# Patient Record
Sex: Female | Born: 1970 | Race: Black or African American | Hispanic: No | Marital: Single | Smoking: Never smoker
Health system: Southern US, Community
[De-identification: ages and names within clinical notes are randomized; demographics above are authoritative.]

## PROBLEM LIST (undated history)

## (undated) DIAGNOSIS — Z9221 Personal history of antineoplastic chemotherapy: Secondary | ICD-10-CM

## (undated) DIAGNOSIS — Z923 Personal history of irradiation: Secondary | ICD-10-CM

## (undated) DIAGNOSIS — M1990 Unspecified osteoarthritis, unspecified site: Secondary | ICD-10-CM

---

## 1997-08-20 ENCOUNTER — Ambulatory Visit (HOSPITAL_COMMUNITY): Admission: RE | Admit: 1997-08-20 | Discharge: 1997-08-20 | Payer: Self-pay | Admitting: *Deleted

## 1997-08-20 ENCOUNTER — Other Ambulatory Visit: Admission: RE | Admit: 1997-08-20 | Discharge: 1997-08-20 | Payer: Self-pay | Admitting: *Deleted

## 1997-10-16 ENCOUNTER — Ambulatory Visit (HOSPITAL_COMMUNITY): Admission: RE | Admit: 1997-10-16 | Discharge: 1997-10-16 | Payer: Self-pay | Admitting: *Deleted

## 1997-11-12 ENCOUNTER — Ambulatory Visit (HOSPITAL_COMMUNITY): Admission: RE | Admit: 1997-11-12 | Discharge: 1997-11-12 | Payer: Self-pay | Admitting: *Deleted

## 1997-11-28 ENCOUNTER — Ambulatory Visit (HOSPITAL_COMMUNITY): Admission: RE | Admit: 1997-11-28 | Discharge: 1997-11-28 | Payer: Self-pay | Admitting: *Deleted

## 1998-01-30 ENCOUNTER — Inpatient Hospital Stay (HOSPITAL_COMMUNITY): Admission: EM | Admit: 1998-01-30 | Discharge: 1998-01-31 | Payer: Self-pay | Admitting: *Deleted

## 2005-02-21 ENCOUNTER — Emergency Department (HOSPITAL_COMMUNITY): Admission: AD | Admit: 2005-02-21 | Discharge: 2005-02-21 | Payer: Self-pay | Admitting: Emergency Medicine

## 2005-07-20 ENCOUNTER — Ambulatory Visit (HOSPITAL_COMMUNITY): Admission: RE | Admit: 2005-07-20 | Discharge: 2005-07-20 | Payer: Self-pay | Admitting: Obstetrics and Gynecology

## 2005-07-20 ENCOUNTER — Encounter (INDEPENDENT_AMBULATORY_CARE_PROVIDER_SITE_OTHER): Payer: Self-pay | Admitting: Specialist

## 2010-02-04 ENCOUNTER — Emergency Department (HOSPITAL_COMMUNITY)
Admission: EM | Admit: 2010-02-04 | Discharge: 2010-02-04 | Payer: Self-pay | Source: Home / Self Care | Admitting: Emergency Medicine

## 2010-07-03 NOTE — Op Note (Signed)
NAMESAYDA, GRABLE               ACCOUNT NO.:  [id number]   MEDICAL RECORD NO.:  [id number]          PATIENT TYPE:  AMB   LOCATION:  SDC                           FACILITY:  [facility]   PHYSICIAN:  Naima A. Dillard, M.D. DATE OF BIRTH:  Sep 11, 1970   DATE OF PROCEDURE:  07/20/2005  DATE OF DISCHARGE:                                 OPERATIVE REPORT   PREOPERATIVE DIAGNOSIS:  Cervical dysplasia and menorrhagia.   POSTOPERATIVE DIAGNOSIS:  Cervical dysplasia and menorrhagia.   PROCEDURE:  Dilatation and curettage hysteroscopy, NovaSure ablation and  LEEP.   SURGEON:  Naima A. Dillard, M.D.   ASSISTANT:  None.   ANESTHESIA:  General laryngeal mask airway and local.   SPECIMENS:  ECC endometrial curettage and cervical LEEP.   ESTIMATED BLOOD LOSS:  Minimal.   COMPLICATIONS:  None.  Patient went to the recovery room in stable  condition.   PROCEDURE:  Patient was taken to the operating room, where she was given  general anesthesia, placed in a dorsal lithotomy position, prepped and  draped in a normal sterile fashion.  A colposcopy was done and noted to have  some acetyl white changes at 12 o'clock.  Next, a single-tooth tenaculum was  placed on the cervix, and the endocervical length was found to be 4 cm.  __________ was 9 cm.  The uterine cavity was at 5 cm.  The cervix was  further dilated with [facility] dilators.  The hysteroscope was placed into the  uterine cavity.  Both ostia were visualized.  All walls of the uterus were  visualized.  There were no submucosal fibroids or polyps.  At that time, the  hysteroscope was then removed.  Endometrial curettings were obtained with a  sharp curette, then endometrial curettings were obtained with a sharp  curette.  The NovaSure was then placed into the uterine cavity and seated.  On the first check, it did fail because you could hear air coming from the  cervix.  This was then corrected with Vaseline gauze.  The NovaSure did  ablate for  105 seconds with a power of 116, and the cavity was noted to be  4.2.  After the NovaSure was done, I did take another look inside the  uterine cavity, and there was noted to be no perforations or problems, and  the ablation was successful.  The deficit was 100 cc, and actually some of  that fell on the floor and under the patient's garment.  The coated speculum  was then placed in the vagina, and the metal speculum was replaced with the  coated speculum.  The cervix was painted with Lugol solution, and the LEEP  was done without difficulty.  The  cervical bed was made hemostatic with Bovie cautery and Monsel's.  The  tenaculum site also had some bleeding and made hemostatic with Bovie  cautery.  All instruments were removed from the vagina.  Sponge, lap, and  needle counts were correct x2.  The patient taken to the recovery room in  stable condition.      Naima A. Normand Sloop, M.D.  Electronically  Signed     NAD/MEDQ  D:  07/20/2005  T:  07/21/2005  Job:  [id number]

## 2013-06-21 ENCOUNTER — Emergency Department (INDEPENDENT_AMBULATORY_CARE_PROVIDER_SITE_OTHER)
Admission: EM | Admit: 2013-06-21 | Discharge: 2013-06-21 | Disposition: A | Discharge status: Home / Self Care | Payer: Self-pay | Source: Home / Self Care

## 2013-06-21 ENCOUNTER — Encounter (HOSPITAL_COMMUNITY): Payer: Self-pay | Admitting: Emergency Medicine

## 2013-06-21 DIAGNOSIS — L910 Hypertrophic scar: Secondary | ICD-10-CM

## 2013-06-21 NOTE — ED Notes (Signed)
See physician note

## 2013-06-21 NOTE — Discharge Instructions (Signed)
Thank you for coming in today. Return if the scar starts changing.

## 2013-06-21 NOTE — ED Provider Notes (Signed)
Amanda Ayers is a 43 y.o. female who presents to Urgent Care today for bump on. Patient notes a bump on her left side of her anterior neck present for over one year. It developed after a small wound. It has been present and unchanged for over one year. The patient's daughter is worried that she may have some for skin cancer. She denies any fevers chills nausea vomiting or diarrhea. The mass does not hurt.   No past medical history on file. History  Substance Use Topics  . Smoking status: Not on file  . Smokeless tobacco: Not on file  . Alcohol Use: Not on file   ROS as above Medications: No current facility-administered medications for this encounter.   No current outpatient prescriptions on file.    Exam:  BP 127/77  Pulse 72  Temp(Src) 98.1 F (36.7 C) (Oral)  Resp 16  SpO2 100% Gen: Well NAD Skin: 1 cm hypertrophic scar on the left anterior neck.   No results found for this or any previous visit (from the past 24 hour(s)). No results found.  Assessment and Plan: 43 y.o. female with masses consistent with keloid or hypertrophic scar. Patient is asymptomatic. Watchful waiting. Handout provided.  Discussed warning signs or symptoms. Please see discharge instructions. Patient expresses understanding.    Gregor Hams, MD 06/21/13 [phone]

## 2017-08-31 ENCOUNTER — Ambulatory Visit (HOSPITAL_COMMUNITY)
Admission: EM | Admit: 2017-08-31 | Discharge: 2017-08-31 | Disposition: A | Discharge status: Home / Self Care | Payer: BLUE CROSS/BLUE SHIELD | Attending: Internal Medicine | Admitting: Internal Medicine

## 2017-08-31 ENCOUNTER — Encounter (HOSPITAL_COMMUNITY): Payer: Self-pay | Admitting: Emergency Medicine

## 2017-08-31 DIAGNOSIS — R21 Rash and other nonspecific skin eruption: Secondary | ICD-10-CM

## 2017-08-31 MED ORDER — TRIAMCINOLONE ACETONIDE 0.1 % EX CREA: 1.0000 "application " | TOPICAL_CREAM | Freq: Two times a day (BID) | CUTANEOUS | 0 refills | Status: DC

## 2017-08-31 MED ORDER — DOXYCYCLINE HYCLATE 100 MG PO CAPS: 100.0000 mg | ORAL_CAPSULE | Freq: Two times a day (BID) | ORAL | 0 refills | Status: DC

## 2017-08-31 NOTE — ED Triage Notes (Signed)
Pt here for discoloration and redness after bug bite to left ankle

## 2017-08-31 NOTE — Discharge Instructions (Signed)
Begin doxycycline twice daily for the next 10 days May apply triamcinolone cream or over-the-counter hydrocortisone cream twice daily to the area with redness  May take antihistamines-Zyrtec/Claritin in the morning, Benadryl in the evening  Continue to monitor, follow-up if spreading, worsening, developing pain or drainage

## 2017-09-01 NOTE — ED Provider Notes (Signed)
[place] [facility]    CSN: [id number] Arrival date & time: 08/31/17  1724     History   Chief Complaint Chief Complaint  Patient presents with  . Rash    HPI Amanda Ayers is a 47 y.o. female no contributing past medical history presenting today for evaluation of possible bug bite/rash.  Patient states that she believes that she had a bug bite Monday evening, since she has developed discoloration and redness around this area.  She notes that it has continued to spread today.  But still localized to left lower leg.  Associated with minimal itching and pain.  States that it feels slightly sore in the area that has spread today.  She has not applied anything to this.  She is unsure if she had any bug bites, but notes that she was in the yard this weekend and frequently likes to sit outside.   HPI  History reviewed. No pertinent past medical history.  There are no active problems to display for this patient.   History reviewed. No pertinent surgical history.  OB History   None      Home Medications    Prior to Admission medications   Medication Sig Start Date End Date Taking? Authorizing Provider  doxycycline (VIBRAMYCIN) 100 MG capsule Take 1 capsule (100 mg total) by mouth 2 (two) times daily. 08/31/17   Wieters, Hallie C, PA-C  triamcinolone cream (KENALOG) 0.1 % Apply 1 application topically 2 (two) times daily. 08/31/17   Wieters, Elesa Hacker, PA-C    Family History History reviewed. No pertinent family history.  Social History Social History   Tobacco Use  . Smoking status: Not on file  Substance Use Topics  . Alcohol use: Not on file  . Drug use: Not on file     Allergies   Patient has no known allergies.   Review of Systems Review of Systems  Constitutional: Negative for fatigue and fever.  Eyes: Negative for visual disturbance.  Respiratory: Negative for shortness of breath.   Cardiovascular: Negative for chest pain.  Gastrointestinal: Negative  for abdominal pain, nausea and vomiting.  Musculoskeletal: Negative for arthralgias and joint swelling.  Skin: Positive for color change and rash. Negative for wound.  Neurological: Negative for dizziness, weakness, light-headedness and headaches.     Physical Exam Triage Vital Signs ED Triage Vitals  Enc Vitals Group     BP 08/31/17 1751 (!) 142/74     Pulse Rate 08/31/17 1751 77     Resp 08/31/17 1751 16     Temp 08/31/17 1751 97.9 F (36.6 C)     Temp Source 08/31/17 1751 Oral     SpO2 08/31/17 1751 100 %     Weight --      Height --      Head Circumference --      Peak Flow --      Pain Score 08/31/17 1824 0     Pain Loc --      Pain Edu? --      Excl. in [place]? --    No data found.  Updated Vital Signs BP (!) 142/74 (BP Location: Right Arm)   Pulse 77   Temp 97.9 F (36.6 C) (Oral)   Resp 16   SpO2 100%   Visual Acuity Right Eye Distance:   Left Eye Distance:   Bilateral Distance:    Right Eye Near:   Left Eye Near:    Bilateral Near:     Physical  Exam  Constitutional: She is oriented to person, place, and time. She appears well-developed and well-nourished.  No acute distress  HENT:  Head: Normocephalic and atraumatic.  Nose: Nose normal.  Eyes: Conjunctivae are normal.  Neck: Neck supple.  Cardiovascular: Normal rate.  Pulmonary/Chest: Effort normal. No respiratory distress.  Abdominal: She exhibits no distension.  Musculoskeletal: Normal range of motion.  Neurological: She is alert and oriented to person, place, and time.  Skin: Skin is warm and dry.  Patient with large area to anterior aspect of left lower leg with central area of slight purple discoloration, and middle of this area there is a small punctate lesion that could represent a bug bite, purple ulcer surrounded by papular erythematous slightly raised lesions.  Slightly tender to touch.  Both purple and erythematous areas non-blanchable.  Psychiatric: She has a normal mood and affect.    Nursing note and vitals reviewed.        UC Treatments / Results  Labs (all labs ordered are listed, but only abnormal results are displayed) Labs Reviewed - No data to display  EKG None  Radiology No results found.  Procedures Procedures (including critical care time)  Medications Ordered in UC Medications - No data to display  Initial Impression / Assessment and Plan / UC Course  I have reviewed the triage vital signs and the nursing notes.  Pertinent labs & imaging results that were available during my care of the patient were reviewed by me and considered in my medical decision making (see chart for details).     Lesion to lower leg did not appear clearly cellulitic or allergic type rash.  Possible tick bite.  Will treat with doxycycline as well as topical steroid cream.  Continue to monitor, follow-up if worsening or spreading.Discussed strict return precautions. Patient verbalized understanding and is agreeable with plan.  Final Clinical Impressions(s) / UC Diagnoses   Final diagnoses:  Rash and nonspecific skin eruption     Discharge Instructions     Begin doxycycline twice daily for the next 10 days May apply triamcinolone cream or over-the-counter hydrocortisone cream twice daily to the area with redness  May take antihistamines-Zyrtec/Claritin in the morning, Benadryl in the evening  Continue to monitor, follow-up if spreading, worsening, developing pain or drainage    ED Prescriptions    Medication Sig Dispense Auth. Provider   doxycycline (VIBRAMYCIN) 100 MG capsule Take 1 capsule (100 mg total) by mouth 2 (two) times daily. 20 capsule Wieters, Hallie C, PA-C   triamcinolone cream (KENALOG) 0.1 % Apply 1 application topically 2 (two) times daily. 30 g Wieters, [place] C, PA-C     Controlled Substance Prescriptions [place] Controlled Substance Registry consulted? Not Applicable   Janith Lima, [place] 09/01/17 [id number]

## 2019-06-07 ENCOUNTER — Ambulatory Visit: Payer: Self-pay

## 2019-06-07 ENCOUNTER — Encounter: Payer: Self-pay | Admitting: Podiatry

## 2019-07-09 NOTE — Progress Notes (Signed)
This encounter was created in error - please disregard.

## 2020-05-16 ENCOUNTER — Ambulatory Visit (INDEPENDENT_AMBULATORY_CARE_PROVIDER_SITE_OTHER): Payer: BC Managed Care – PPO

## 2020-05-16 ENCOUNTER — Other Ambulatory Visit: Payer: Self-pay

## 2020-05-16 ENCOUNTER — Ambulatory Visit: Payer: BC Managed Care – PPO | Admitting: Podiatry

## 2020-05-16 DIAGNOSIS — M7731 Calcaneal spur, right foot: Secondary | ICD-10-CM | POA: Diagnosis not present

## 2020-05-16 DIAGNOSIS — M79676 Pain in unspecified toe(s): Secondary | ICD-10-CM

## 2020-05-16 DIAGNOSIS — M722 Plantar fascial fibromatosis: Secondary | ICD-10-CM

## 2020-05-16 DIAGNOSIS — M7732 Calcaneal spur, left foot: Secondary | ICD-10-CM

## 2020-05-16 DIAGNOSIS — M216X9 Other acquired deformities of unspecified foot: Secondary | ICD-10-CM

## 2020-05-16 DIAGNOSIS — L600 Ingrowing nail: Secondary | ICD-10-CM

## 2020-05-16 MED ORDER — BETAMETHASONE SOD PHOS & ACET 6 (3-3) MG/ML IJ SUSP
12.0000 mg | Freq: Once | INTRAMUSCULAR | Status: AC
  Administered 2020-05-16: 12 mg

## 2020-05-16 NOTE — Progress Notes (Signed)
  Subjective:  Patient ID: Amanda Ayers, female    DOB: [birth year],  MRN: [id number]  Chief Complaint  Patient presents with  . Foot Pain    Bilateral heel pain. Left more than right. Sharp pain associated with edema.  . Toe Pain    Right great toe pain locate at medial side of toe, burning pain associated with redness, and edema.    50 y.o. female presents with the above complaint. History confirmed with patient.   Objective:  Physical Exam: warm, good capillary refill, no trophic changes or ulcerative lesions, normal DP and PT pulses and normal sensory exam. Left Foot: tenderness to palpation medial calcaneal tuber, no pain with calcaneal squeeze, decreased ankle joint ROM and +Silverskiold test  Ingrown nail to the left lateral border of the great toe Right Foot: tenderness to palpation medial calcaneal tuber, no pain with calcaneal squeeze, decreased ankle joint ROM and +Silverskiold test.  Ingrown nail to the right lateral border of the great toe  Radiographs: X-ray of both feet: no evidence of calcaneal stress fracture, plantar calcaneal spur, posterior calcaneal spur and Haglund deformity noted  Assessment:   1. Plantar fasciitis, bilateral   2. Equinus deformity of foot   3. Calcaneal spur of left foot   4. Calcaneal spur of right foot   5. Ingrown nail   6. Pain around toenail    Plan:  Patient was evaluated and treated and all questions answered.  Plantar Fasciitis -XR reviewed with patient -Educated patient on stretching and icing of the affected limb -Night splint dispensed -Plantar fascial brace dispensed -Injection delivered to the plantar fascia of both feet.  Procedure: Injection Tendon/Ligament Consent: Verbal consent obtained. Location: Bilateral plantar fascia at the glabrous junction; medial approach. Skin Prep: Alcohol. Injectate: 1 cc 0.5% marcaine plain, 1 cc betamethasone acetate-betamethasone sodium phosphate Disposition: Patient tolerated  procedure well. Injection site dressed with a band-aid.  Ingrown toenails bilateral great toes -Left great toenail was able to be debrided in slant back fashion, right was too deep to do so today.  We will have patient come back in 2 weeks for planned ingrown nail removal of right possible left toe   Return in about 18 days (around 06/03/2020).

## 2020-05-19 ENCOUNTER — Telehealth: Payer: Self-pay | Admitting: Podiatry

## 2020-05-19 DIAGNOSIS — M79676 Pain in unspecified toe(s): Secondary | ICD-10-CM

## 2020-05-19 NOTE — Telephone Encounter (Signed)
Patient requesting intermittent FMLA, please Advise?

## 2020-05-20 NOTE — Telephone Encounter (Signed)
I'm fine with that we can do that

## 2020-06-03 ENCOUNTER — Ambulatory Visit: Payer: BC Managed Care – PPO | Admitting: Podiatry

## 2020-06-06 ENCOUNTER — Ambulatory Visit: Payer: BC Managed Care – PPO | Admitting: Podiatry

## 2020-06-20 ENCOUNTER — Ambulatory Visit (INDEPENDENT_AMBULATORY_CARE_PROVIDER_SITE_OTHER): Payer: BC Managed Care – PPO | Admitting: Podiatry

## 2020-06-20 DIAGNOSIS — Z5329 Procedure and treatment not carried out because of patient's decision for other reasons: Secondary | ICD-10-CM

## 2020-06-20 NOTE — Progress Notes (Signed)
No show for appt. 

## 2021-05-25 ENCOUNTER — Ambulatory Visit: Payer: BC Managed Care – PPO | Admitting: Internal Medicine

## 2021-06-03 ENCOUNTER — Ambulatory Visit (INDEPENDENT_AMBULATORY_CARE_PROVIDER_SITE_OTHER): Payer: BC Managed Care – PPO | Admitting: Internal Medicine

## 2021-06-03 ENCOUNTER — Telehealth: Payer: Self-pay

## 2021-06-03 ENCOUNTER — Encounter: Payer: Self-pay | Admitting: Internal Medicine

## 2021-06-03 VITALS — BP 112/68 | HR 71 | Resp 18 | Ht 68.0 in | Wt 245.2 lb

## 2021-06-03 DIAGNOSIS — E669 Obesity, unspecified: Secondary | ICD-10-CM | POA: Diagnosis not present

## 2021-06-03 DIAGNOSIS — Z1211 Encounter for screening for malignant neoplasm of colon: Secondary | ICD-10-CM

## 2021-06-03 DIAGNOSIS — Z0000 Encounter for general adult medical examination without abnormal findings: Secondary | ICD-10-CM

## 2021-06-03 DIAGNOSIS — Z1231 Encounter for screening mammogram for malignant neoplasm of breast: Secondary | ICD-10-CM | POA: Diagnosis not present

## 2021-06-03 MED ORDER — SEMAGLUTIDE-WEIGHT MANAGEMENT 1 MG/0.5ML ~~LOC~~ SOAJ: 1.0000 mg | SUBCUTANEOUS | 0 refills | Status: DC

## 2021-06-03 MED ORDER — SEMAGLUTIDE-WEIGHT MANAGEMENT 2.4 MG/0.75ML ~~LOC~~ SOAJ: 2.4000 mg | SUBCUTANEOUS | 0 refills | Status: DC

## 2021-06-03 MED ORDER — SEMAGLUTIDE-WEIGHT MANAGEMENT 1.7 MG/0.75ML ~~LOC~~ SOAJ: 1.7000 mg | SUBCUTANEOUS | 0 refills | Status: DC

## 2021-06-03 MED ORDER — SEMAGLUTIDE-WEIGHT MANAGEMENT 0.25 MG/0.5ML ~~LOC~~ SOAJ: 0.2500 mg | SUBCUTANEOUS | 0 refills | Status: DC

## 2021-06-03 MED ORDER — SEMAGLUTIDE-WEIGHT MANAGEMENT 0.5 MG/0.5ML ~~LOC~~ SOAJ: 0.5000 mg | SUBCUTANEOUS | 0 refills | Status: DC

## 2021-06-03 NOTE — Telephone Encounter (Signed)
Pt is requesting a pill form medication as the injection was not covered. ? ?Please advise ?

## 2021-06-03 NOTE — Patient Instructions (Addendum)
We have sent in the wegovy shots to do.  ? ?Start with 0.25 mg weekly for the first month. ? ?Then do 0.5 mg weekly for the second month. ? ?Then do 1 mg weekly for the third month. ? ?Then do 1.7 mg weekly for the 4th month. ? ?Then do 2.4 mg weekly for the 5th month. ? ?If you have any problems or questions about this let us know. ? ?We have ordered the mammogram and will get you in for the colonoscopy. ? ? ?

## 2021-06-03 NOTE — Progress Notes (Signed)
? ?  Subjective:  ? ?Patient ID: Amanda Ayers, female    DOB: Oct 08, 1970, 51 y.o.   MRN: [id number] ? ?HPI ?The patient is a new 51 YO female coming in for ongoing care and weight. Desires physical as well.  ? ?PMH, [facility], social history reviewed and updated ? ?Review of Systems  ?Constitutional: Negative.   ?HENT: Negative.    ?Eyes: Negative.   ?Respiratory:  Negative for cough, chest tightness and shortness of breath.   ?Cardiovascular:  Negative for chest pain, palpitations and leg swelling.  ?Gastrointestinal:  Negative for abdominal distention, abdominal pain, constipation, diarrhea, nausea and vomiting.  ?Musculoskeletal: Negative.   ?Skin: Negative.   ?Neurological: Negative.   ?Psychiatric/Behavioral: Negative.    ? ?Objective:  ?Physical Exam ?Constitutional:   ?   Appearance: She is well-developed. She is obese.  ?HENT:  ?   Head: Normocephalic and atraumatic.  ?Cardiovascular:  ?   Rate and Rhythm: Normal rate and regular rhythm.  ?Pulmonary:  ?   Effort: Pulmonary effort is normal. No respiratory distress.  ?   Breath sounds: Normal breath sounds. No wheezing or rales.  ?Abdominal:  ?   General: Bowel sounds are normal. There is no distension.  ?   Palpations: Abdomen is soft.  ?   Tenderness: There is no abdominal tenderness. There is no rebound.  ?Musculoskeletal:  ?   Cervical back: Normal range of motion.  ?Skin: ?   General: Skin is warm and dry.  ?Neurological:  ?   Mental Status: She is alert and oriented to person, place, and time.  ?   Coordination: Coordination normal.  ? ? ?Vitals:  ? 06/03/21 0846  ?BP: 112/68  ?Pulse: 71  ?Resp: 18  ?SpO2: 98%  ?Weight: 245 lb 3.2 oz (111.2 kg)  ?Height: '5\' 8"'$  (1.727 m)  ? ? ?This visit occurred during the SARS-CoV-2 public health emergency.  Safety protocols were in place, including screening questions prior to the visit, additional usage of staff PPE, and extensive cleaning of exam room while observing appropriate contact time as indicated for disinfecting  solutions.  ? ?Assessment & Plan:  ?Shingrix IM given at visit ?

## 2021-06-04 NOTE — Telephone Encounter (Signed)
Pt states that insurance will not cover weightloss meds but she would like try phentermine and she said she can afford this out of pocket. ? ?Please advise ?

## 2021-06-04 NOTE — Telephone Encounter (Signed)
Noted  

## 2021-06-04 NOTE — Telephone Encounter (Signed)
Pt called I advised the pt of Dr. Sharlet Salina recommendation to try try qsymia or contrave she should check with insurance to see which is covered.  ? ?Pt was advised to ask what else is available if they don't cover those. Pt was asked to call us back with that information or Mychart the information. ? ?FYI ?

## 2021-06-04 NOTE — Telephone Encounter (Signed)
We can try qsymia or contrave she should check with insurance to see which is covered ?

## 2021-06-05 DIAGNOSIS — Z0000 Encounter for general adult medical examination without abnormal findings: Secondary | ICD-10-CM | POA: Insufficient documentation

## 2021-06-05 DIAGNOSIS — E669 Obesity, unspecified: Secondary | ICD-10-CM | POA: Insufficient documentation

## 2021-06-05 HISTORY — DX: Encounter for general adult medical examination without abnormal findings: Z00.00

## 2021-06-05 NOTE — Assessment & Plan Note (Signed)
She has tried phentermine multiple months in the past. We did counsel that this is not safe for long term usage. She would like to try wegovy so this is prescribed with typical 4 month titration. If not covered we have other options we can try.  ?

## 2021-06-05 NOTE — Telephone Encounter (Signed)
Phentermine is not safe and I do not prescribe this. We can try wellbutrin and/or naltrexone separate (they are in combination in one of the weight loss pills but separately are both generic). Does she want to try this? ?

## 2021-06-05 NOTE — Assessment & Plan Note (Signed)
Flu shot yearly. Covid-19 counseled. Shingrix given 1st today. Tetanus declines today. Colonoscopy referral to GI done. Mammogram ordered, pap smear informed due. Counseled about sun safety and mole surveillance. Counseled about the dangers of distracted driving. Given 10 year screening recommendations.  ? ?

## 2021-06-06 ENCOUNTER — Encounter: Payer: Self-pay | Admitting: Internal Medicine

## 2021-06-10 ENCOUNTER — Encounter: Payer: Self-pay | Admitting: Internal Medicine

## 2021-06-10 MED ORDER — BUPROPION HCL ER (XL) 150 MG PO TB24: 150.0000 mg | ORAL_TABLET | Freq: Every day | ORAL | 0 refills | Status: DC

## 2021-06-10 MED ORDER — NALTREXONE HCL 50 MG PO TABS: 25.0000 mg | ORAL_TABLET | Freq: Every day | ORAL | 0 refills | Status: DC

## 2021-06-10 NOTE — Telephone Encounter (Signed)
Spoke with the pt and she is willing to try this option instead of phentermine.  ?

## 2021-06-10 NOTE — Addendum Note (Signed)
Addended by: Pricilla Holm A on: 06/10/2021 11:28 AM ? ? Modules accepted: Orders ? ?

## 2021-06-11 ENCOUNTER — Encounter: Payer: Self-pay | Admitting: Internal Medicine

## 2021-06-19 ENCOUNTER — Ambulatory Visit
Admission: RE | Admit: 2021-06-19 | Discharge: 2021-06-19 | Disposition: A | Discharge status: Home / Self Care | Payer: BC Managed Care – PPO | Source: Ambulatory Visit | Attending: Internal Medicine | Admitting: Internal Medicine

## 2021-06-19 DIAGNOSIS — Z1231 Encounter for screening mammogram for malignant neoplasm of breast: Secondary | ICD-10-CM

## 2021-06-22 ENCOUNTER — Other Ambulatory Visit: Payer: Self-pay | Admitting: Internal Medicine

## 2021-06-22 DIAGNOSIS — R928 Other abnormal and inconclusive findings on diagnostic imaging of breast: Secondary | ICD-10-CM

## 2021-07-02 ENCOUNTER — Ambulatory Visit
Admission: RE | Admit: 2021-07-02 | Discharge: 2021-07-02 | Disposition: A | Discharge status: Home / Self Care | Payer: BC Managed Care – PPO | Source: Ambulatory Visit | Attending: Internal Medicine | Admitting: Internal Medicine

## 2021-07-02 ENCOUNTER — Other Ambulatory Visit: Payer: Self-pay | Admitting: Internal Medicine

## 2021-07-02 DIAGNOSIS — N6320 Unspecified lump in the left breast, unspecified quadrant: Secondary | ICD-10-CM

## 2021-07-02 DIAGNOSIS — R928 Other abnormal and inconclusive findings on diagnostic imaging of breast: Secondary | ICD-10-CM

## 2021-07-06 ENCOUNTER — Ambulatory Visit
Admission: RE | Admit: 2021-07-06 | Discharge: 2021-07-06 | Disposition: A | Discharge status: Home / Self Care | Payer: BC Managed Care – PPO | Source: Ambulatory Visit | Attending: Internal Medicine | Admitting: Internal Medicine

## 2021-07-06 DIAGNOSIS — N6320 Unspecified lump in the left breast, unspecified quadrant: Secondary | ICD-10-CM

## 2021-07-07 ENCOUNTER — Telehealth: Payer: Self-pay | Admitting: Hematology and Oncology

## 2021-07-07 LAB — SURGICAL PATHOLOGY

## 2021-07-07 NOTE — Telephone Encounter (Signed)
Left message for patient to return call in reference to clinic appointment for 5/31

## 2021-07-07 NOTE — Telephone Encounter (Signed)
Spoke to patient to confirm morning clinic appointment for 5/31, paperwork will be mailed

## 2021-07-14 ENCOUNTER — Encounter: Payer: Self-pay | Admitting: *Deleted

## 2021-07-14 DIAGNOSIS — Z170 Estrogen receptor positive status [ER+]: Secondary | ICD-10-CM

## 2021-07-14 DIAGNOSIS — C50212 Malignant neoplasm of upper-inner quadrant of left female breast: Secondary | ICD-10-CM | POA: Insufficient documentation

## 2021-07-14 HISTORY — DX: Malignant neoplasm of upper-inner quadrant of left female breast: C50.212

## 2021-07-14 HISTORY — DX: Malignant neoplasm of upper-inner quadrant of left female breast: Z17.0

## 2021-07-15 ENCOUNTER — Inpatient Hospital Stay: Payer: BC Managed Care – PPO | Attending: Hematology and Oncology | Admitting: Hematology and Oncology

## 2021-07-15 ENCOUNTER — Encounter: Payer: Self-pay | Admitting: *Deleted

## 2021-07-15 ENCOUNTER — Inpatient Hospital Stay: Payer: BC Managed Care – PPO

## 2021-07-15 ENCOUNTER — Ambulatory Visit
Admission: RE | Admit: 2021-07-15 | Discharge: 2021-07-15 | Disposition: A | Discharge status: Home / Self Care | Payer: BC Managed Care – PPO | Source: Ambulatory Visit | Attending: Radiation Oncology | Admitting: Radiation Oncology

## 2021-07-15 ENCOUNTER — Encounter: Payer: Self-pay | Admitting: Physical Therapy

## 2021-07-15 ENCOUNTER — Other Ambulatory Visit: Payer: Self-pay | Admitting: General Surgery

## 2021-07-15 ENCOUNTER — Other Ambulatory Visit: Payer: Self-pay

## 2021-07-15 ENCOUNTER — Ambulatory Visit: Payer: BC Managed Care – PPO | Attending: General Surgery | Admitting: Physical Therapy

## 2021-07-15 ENCOUNTER — Inpatient Hospital Stay: Payer: BC Managed Care – PPO | Admitting: Licensed Clinical Social Worker

## 2021-07-15 ENCOUNTER — Encounter: Payer: Self-pay | Admitting: General Practice

## 2021-07-15 ENCOUNTER — Encounter: Payer: Self-pay | Admitting: Hematology and Oncology

## 2021-07-15 ENCOUNTER — Inpatient Hospital Stay (HOSPITAL_BASED_OUTPATIENT_CLINIC_OR_DEPARTMENT_OTHER): Payer: BC Managed Care – PPO | Admitting: Genetic Counselor

## 2021-07-15 DIAGNOSIS — C50212 Malignant neoplasm of upper-inner quadrant of left female breast: Secondary | ICD-10-CM | POA: Insufficient documentation

## 2021-07-15 DIAGNOSIS — R293 Abnormal posture: Secondary | ICD-10-CM | POA: Diagnosis present

## 2021-07-15 DIAGNOSIS — Z170 Estrogen receptor positive status [ER+]: Secondary | ICD-10-CM

## 2021-07-15 LAB — CMP (CANCER CENTER ONLY)
ALT: 11 U/L (ref 0–44)
AST: 13 U/L — ABNORMAL LOW (ref 15–41)
Albumin: 4.3 g/dL (ref 3.5–5.0)
Alkaline Phosphatase: 31 U/L — ABNORMAL LOW (ref 38–126)
Anion gap: 4 — ABNORMAL LOW (ref 5–15)
BUN: 17 mg/dL (ref 6–20)
CO2: 28 mmol/L (ref 22–32)
Calcium: 9.5 mg/dL (ref 8.9–10.3)
Chloride: 105 mmol/L (ref 98–111)
Creatinine: 0.99 mg/dL (ref 0.44–1.00)
GFR, Estimated: >60 mL/min (ref >60)
Glucose, Bld: 86 mg/dL (ref 70–99)
Potassium: 4 mmol/L (ref 3.5–5.1)
Sodium: 137 mmol/L (ref 135–145)
Total Bilirubin: 0.4 mg/dL (ref 0.3–1.2)
Total Protein: 7.2 g/dL (ref 6.5–8.1)

## 2021-07-15 LAB — CBC WITH DIFFERENTIAL (CANCER CENTER ONLY)
Abs Immature Granulocytes: 0.02 10*3/uL (ref 0.00–0.07)
Basophils Absolute: 0.1 10*3/uL (ref 0.0–0.1)
Basophils Relative: 1 %
Eosinophils Absolute: 0.2 10*3/uL (ref 0.0–0.5)
Eosinophils Relative: 3 %
HCT: 36.6 % (ref 36.0–46.0)
Hemoglobin: 12 g/dL (ref 12.0–15.0)
Immature Granulocytes: 0 %
Lymphocytes Relative: 22 %
Lymphs Abs: 1.3 10*3/uL (ref 0.7–4.0)
MCH: 31.9 pg (ref 26.0–34.0)
MCHC: 32.8 g/dL (ref 30.0–36.0)
MCV: 97.3 fL (ref 80.0–100.0)
Monocytes Absolute: 0.6 10*3/uL (ref 0.1–1.0)
Monocytes Relative: 11 %
Neutro Abs: 3.7 10*3/uL (ref 1.7–7.7)
Neutrophils Relative %: 63 %
Platelet Count: 279 10*3/uL (ref 150–400)
RBC: 3.76 MIL/uL — ABNORMAL LOW (ref 3.87–5.11)
RDW: 11.9 % (ref 11.5–15.5)
WBC Count: 5.8 10*3/uL (ref 4.0–10.5)
nRBC: 0 % (ref 0.0–0.2)

## 2021-07-15 LAB — GENETIC SCREENING ORDER

## 2021-07-15 LAB — RESEARCH LABS

## 2021-07-15 NOTE — Progress Notes (Signed)
Radiation Oncology         [phone] ________________________________  Name: Amanda Ayers        MRN: [id number]  Date of Service: 07/15/2021 DOB: October 08, 1970  HY:[id number], Real Cons, MD  Stark Klein, MD     REFERRING PHYSICIAN: Stark Klein, MD   DIAGNOSIS: The encounter diagnosis was Malignant neoplasm of upper-inner quadrant of left breast in female, estrogen receptor positive ([place]).   HISTORY OF PRESENT ILLNESS: Amanda Ayers is a 51 y.o. female seen in the multidisciplinary breast clinic for a new diagnosis of left breast cancer. The patient was noted to have screening assymmetry in the left breast in the medial central location. By diagnostic imaging there was a mass in the 10:00 position measuring 9 mm. Her left axilla was negative for adenopathy. She underwent biopsy on 07/06/21 that showed a grade 2 invasive ductal carcinoma that was ER/PR positive, HER2 negative with a Ki 67 of 60%. She's seen to discuss treatment recommendations of her cancer.     PREVIOUS RADIATION THERAPY: No   PAST MEDICAL HISTORY:  Past Medical History:  Diagnosis Date   Malignant neoplasm of upper-inner quadrant of left breast in female, estrogen receptor positive ([place]) 07/14/2021       PAST SURGICAL HISTORY:No past surgical history on file.   FAMILY HISTORY: No family history on file.   SOCIAL HISTORY:  reports that she has never smoked. She has never been exposed to tobacco smoke. She has never used smokeless tobacco. She reports current alcohol use of about 1.0 standard drink per week. She reports that she does not use drugs. The patient is single and lives in [place]. She works at [employer]: Prednisone   MEDICATIONS:  Current Outpatient Medications  Medication Sig Dispense Refill   buPROPion (WELLBUTRIN XL) 150 MG 24 hr tablet Take 1 tablet (150 mg total) by mouth daily. 90 tablet 0   naltrexone (DEPADE) 50 MG tablet Take 0.5 tablets (25 mg total) by mouth  daily. 45 tablet 0   No current facility-administered medications for this encounter.     REVIEW OF SYSTEMS: On review of systems, the patient reports that she is doing well. No breast specific complaints are noted.      PHYSICAL EXAM:  Wt Readings from Last 3 Encounters:  07/15/21 244 lb 12.8 oz (111 kg)  06/03/21 245 lb 3.2 oz (111.2 kg)   Temp Readings from Last 3 Encounters:  07/15/21 97.8 F (36.6 C) (Temporal)  08/31/17 97.9 F (36.6 C) (Oral)  06/21/13 98.1 F (36.7 C) (Oral)   BP Readings from Last 3 Encounters:  07/15/21 123/87  06/03/21 112/68  08/31/17 (!) 142/74   Pulse Readings from Last 3 Encounters:  07/15/21 68  06/03/21 71  08/31/17 77    In general this is a well appearing African American female in no acute distress. She's alert and oriented x4 and appropriate throughout the examination. Cardiopulmonary assessment is negative for acute distress and she exhibits normal effort. Bilateral breast exam is deferred.    ECOG = 0  0 - Asymptomatic (Fully active, able to carry on all predisease activities without restriction)  1 - Symptomatic but completely ambulatory (Restricted in physically strenuous activity but ambulatory and able to carry out work of a light or sedentary nature. For example, light housework, office work)  2 - Symptomatic, <50% in bed during the day (Ambulatory and capable of all self care but unable to carry out any work activities. Up  and about more than 50% of waking hours)  3 - Symptomatic, >50% in bed, but not bedbound (Capable of only limited self-care, confined to bed or chair 50% or more of waking hours)  4 - Bedbound (Completely disabled. Cannot carry on any self-care. Totally confined to bed or chair)  5 - Death   Eustace Pen MM, Creech RH, Tormey DC, et al. [phone]). "Toxicity and response criteria of the [facility]". [place] Oncol. 5 (6): 649-55    LABORATORY DATA:  Lab Results  Component Value  Date   WBC 5.8 07/15/2021   HGB 12.0 07/15/2021   HCT 36.6 07/15/2021   MCV 97.3 07/15/2021   PLT 279 07/15/2021   Lab Results  Component Value Date   NA 137 07/15/2021   K 4.0 07/15/2021   CL 105 07/15/2021   CO2 28 07/15/2021   Lab Results  Component Value Date   ALT 11 07/15/2021   AST 13 (L) 07/15/2021   ALKPHOS 31 (L) 07/15/2021   BILITOT 0.4 07/15/2021      RADIOGRAPHY: US BREAST LTD UNI LEFT INC AXILLA  Result Date: 07/02/2021 CLINICAL DATA:  Patient returns after baseline screening study for evaluation possible LEFT breast asymmetry. EXAM: DIGITAL DIAGNOSTIC UNILATERAL LEFT MAMMOGRAM WITH TOMOSYNTHESIS AND CAD; ULTRASOUND LEFT BREAST LIMITED TECHNIQUE: Left digital diagnostic mammography and breast tomosynthesis was performed. The images were evaluated with computer-aided detection.; Targeted ultrasound examination of the left breast was performed. COMPARISON:  06/19/2021 ACR Breast Density Category b: There are scattered areas of fibroglandular density. FINDINGS: Additional 2-D and 3-D images are performed. These views confirm presence of a spiculated mass in the MEDIAL aspect of the LEFT breast. On physical exam, I palpate no mass in the MEDIAL aspect of the LEFT breast. Targeted ultrasound is performed, showing a spiculated irregular mass with indistinct and spiculated margins in the 10 o'clock location of the LEFT breast 20 centimeters from the nipple. No internal blood flow identified with Doppler evaluation. Mass measures 0.9 x 0.5 x 0.6 centimeters and is taller than wide. Evaluation of the LEFT axilla is negative for adenopathy. IMPRESSION: Suspicious mass in the 10 o'clock location of the LEFT breast. RECOMMENDATION: Ultrasound-guided core biopsy of LEFT breast mass. I have discussed the findings and recommendations with the patient. If applicable, a reminder letter will be sent to the patient regarding the next appointment. BI-RADS CATEGORY  4: Suspicious. Electronically  Signed   By: Nolon Nations M.D.   On: 07/02/2021 10:55  MM DIAG BREAST TOMO UNI LEFT  Result Date: 07/02/2021 CLINICAL DATA:  Patient returns after baseline screening study for evaluation possible LEFT breast asymmetry. EXAM: DIGITAL DIAGNOSTIC UNILATERAL LEFT MAMMOGRAM WITH TOMOSYNTHESIS AND CAD; ULTRASOUND LEFT BREAST LIMITED TECHNIQUE: Left digital diagnostic mammography and breast tomosynthesis was performed. The images were evaluated with computer-aided detection.; Targeted ultrasound examination of the left breast was performed. COMPARISON:  06/19/2021 ACR Breast Density Category b: There are scattered areas of fibroglandular density. FINDINGS: Additional 2-D and 3-D images are performed. These views confirm presence of a spiculated mass in the MEDIAL aspect of the LEFT breast. On physical exam, I palpate no mass in the MEDIAL aspect of the LEFT breast. Targeted ultrasound is performed, showing a spiculated irregular mass with indistinct and spiculated margins in the 10 o'clock location of the LEFT breast 20 centimeters from the nipple. No internal blood flow identified with Doppler evaluation. Mass measures 0.9 x 0.5 x 0.6 centimeters and is taller than wide. Evaluation of the  LEFT axilla is negative for adenopathy. IMPRESSION: Suspicious mass in the 10 o'clock location of the LEFT breast. RECOMMENDATION: Ultrasound-guided core biopsy of LEFT breast mass. I have discussed the findings and recommendations with the patient. If applicable, a reminder letter will be sent to the patient regarding the next appointment. BI-RADS CATEGORY  4: Suspicious. Electronically Signed   By: Nolon Nations M.D.   On: 07/02/2021 10:55  MM 3D SCREEN BREAST BILATERAL  Result Date: 06/19/2021 CLINICAL DATA:  Screening. This is the patient's initial baseline mammogram. EXAM: DIGITAL SCREENING BILATERAL MAMMOGRAM WITH TOMOSYNTHESIS AND CAD TECHNIQUE: Bilateral screening digital craniocaudal and mediolateral oblique  mammograms were obtained. Bilateral screening digital breast tomosynthesis was performed. The images were evaluated with computer-aided detection. COMPARISON:  None. ACR Breast Density Category b: There are scattered areas of fibroglandular density. FINDINGS: In the left breast, a possible asymmetry warrants further evaluation. In the right breast, no findings suspicious for malignancy. IMPRESSION: Further evaluation is suggested for possible asymmetry in the left breast. RECOMMENDATION: Diagnostic mammogram and possibly ultrasound of the left breast. (Code:[id number]) The patient will be contacted regarding the findings, and additional imaging will be scheduled. BI-RADS CATEGORY  0: Incomplete. Need additional imaging evaluation and/or prior mammograms for comparison. Electronically Signed   By: Evangeline Dakin M.D.   On: 06/19/2021 17:00   MM CLIP PLACEMENT LEFT  Result Date: 07/06/2021 CLINICAL DATA:  Status post ultrasound-guided biopsy of a LEFT breast mass at the 10 o'clock axis. EXAM: 3D DIAGNOSTIC LEFT MAMMOGRAM POST ULTRASOUND BIOPSY COMPARISON:  Previous exam(s). FINDINGS: 3D Mammographic images were obtained following ultrasound guided biopsy of the LEFT breast mass at the 10 o'clock axis. The biopsy marking clip is in expected position at the site of biopsy. IMPRESSION: Appropriate positioning of the ribbon shaped biopsy marking clip at the site of biopsy in the inner LEFT breast corresponding to the targeted mass at the 10 o'clock axis. Final Assessment: Post Procedure Mammograms for Marker Placement Electronically Signed   By: Franki Cabot M.D.   On: 07/06/2021 12:31  [place] LT BREAST BX W LOC DEV 1ST LESION IMG BX SPEC US GUIDE  Addendum Date: 07/08/2021   ADDENDUM REPORT: 07/08/2021 07:55 ADDENDUM: Pathology revealed GRADE II INVASIVE DUCTAL CARCINOMA of the LEFT breast, 10:00 o'clock, 20 cmfn, (ribbon clip). This was found to be concordant by Dr. Franki Cabot. Pathology results were discussed  with the patient by telephone. The patient reported doing well after the biopsy with tenderness at the site. Post biopsy instructions and care were reviewed and questions were answered. The patient was encouraged to call [facility] for any additional concerns. My direct phone number was provided. The patient was referred to [facility] on Jul 15, 2021. Pathology results reported by Terie Purser, RN on 07/08/2021. Electronically Signed   By: Franki Cabot M.D.   On: 07/08/2021 07:55   Result Date: 07/08/2021 CLINICAL DATA:  Patient with a LEFT breast mass at the 10 o'clock axis presents today for ultrasound-guided core biopsy. EXAM: ULTRASOUND GUIDED LEFT BREAST CORE NEEDLE BIOPSY COMPARISON:  None Available. PROCEDURE: I met with the patient and we discussed the procedure of ultrasound-guided biopsy, including benefits and alternatives. We discussed the high likelihood of a successful procedure. We discussed the risks of the procedure, including infection, bleeding, tissue injury, clip migration, and inadequate sampling. Informed written consent was given. The usual time-out protocol was performed immediately prior to the procedure.  Lesion quadrant: Upper inner quadrant Using sterile technique and 1% Lidocaine as local anesthetic, under direct ultrasound visualization, a 12 gauge spring-loaded device was used to perform biopsy of the LEFT breast mass at the 10 o'clock axis using an inferior approach. At the conclusion of the procedure , a ribbon shaped tissue marker clip was deployed into the biopsy cavity. Follow up 2 view mammogram was performed and dictated separately. IMPRESSION: Ultrasound guided biopsy of the LEFT breast mass at the 10 o'clock axis. No apparent complications. Electronically Signed: By: Franki Cabot M.D. On: 07/06/2021 12:07      IMPRESSION/PLAN: 1. Stage IA, [id number], grade 2, ER/PR  positive invasive ductal carcinoma of the left breast. Dr. Lisbeth Renshaw discusses the pathology findings and reviews the nature of left breast disease. The consensus from the breast conference includes breast conservation with lumpectomy with sentinel node biopsy. Dr. Chryl Heck plans to order Oncotype Dx score to determine a role for systemic therapy. Provided that chemotherapy is not indicated, the patient's course would then be followed by external radiotherapy to the breast  to reduce risks of local recurrence followed by antiestrogen therapy. We discussed the risks, benefits, short, and long term effects of radiotherapy, as well as the curative intent, and the patient is interested in proceeding. Dr. Lisbeth Renshaw discusses the delivery and logistics of radiotherapy and anticipates a course of 4 or 6 1/2 weeks of radiotherapy. We will see her back a few weeks after surgery to discuss the simulation process and anticipate we starting radiotherapy about 4-6 weeks after surgery.  2. Possible genetic predisposition to malignancy. The patient is a candidate for genetic testing given her personal history. She was offered referral and will see genetics today.   In a visit lasting 60 minutes, greater than 50% of the time was spent face to face reviewing her case, as well as in preparation of, discussing, and coordinating the patient's care.  The above documentation reflects my direct findings during this shared patient visit. Please see the separate note by Dr. Lisbeth Renshaw on this date for the remainder of the patient's plan of care.    Carola Rhine, [facility]    **Disclaimer: This note was dictated with voice recognition software. Similar sounding words can inadvertently be transcribed and this note may contain transcription errors which may not have been corrected upon publication of note.**

## 2021-07-15 NOTE — Progress Notes (Signed)
[facility] NOTE  Patient Care Team: Hoyt Koch, MD as PCP - General (Internal Medicine) Stark Klein, MD as Consulting Physician (General Surgery) Benay Pike, MD as Consulting Physician (Hematology and Oncology) Kyung Rudd, MD as Consulting Physician (Radiation Oncology) Mauro Kaufmann, RN as Oncology Nurse Navigator Rockwell Germany, RN as Oncology Nurse Navigator  CHIEF COMPLAINTS/PURPOSE OF CONSULTATION:  Newly diagnosed breast cancer  HISTORY OF PRESENTING ILLNESS:  Amanda Ayers 51 y.o. female is here because of recent diagnosis of left breast cancer  I reviewed her records extensively and collaborated the history with the patient.  SUMMARY OF ONCOLOGIC HISTORY: Oncology History  Malignant neoplasm of upper-inner quadrant of left breast in female, estrogen receptor positive ([place])  06/19/2021 Mammogram   Mammogram from Jun 19, 2021 showed possible asymmetry in the left breast.  Diagnostic mammogram showed suspicious mass in the 10:00 location of the left breast.  Ultrasound of the left axilla negative for lymphadenopathy   07/14/2021 Initial Diagnosis   Malignant neoplasm of upper-inner quadrant of left breast in female, estrogen receptor positive [facility])    Pathology Results   Pathology from the left breast showed invasive ductal carcinoma grade 2 out of 3.  Prognostic showed ER 90% positive strong staining, PR 95% strong staining, Ki-67 of 60% and HER2 equivocal by IHC and FISH is pending    Interval History  Amanda Ayers is here for an initial visit with her 2 daughters.  She is a very healthy 72 year old with no past medical history, does not take any medication except for some over-the-counter medication such as Advil PM for sleep.  She has never had any abnormal mammograms.  She did not feel any masses in her breast.  She feels very well overall.  Rest of the pertinent 10 point ROS reviewed and negative.  MEDICAL HISTORY:  Past  Medical History:  Diagnosis Date   Malignant neoplasm of upper-inner quadrant of left breast in female, estrogen receptor positive ([place]) 07/14/2021    SURGICAL HISTORY: No past surgical history on file.  SOCIAL HISTORY: Social History   Socioeconomic History   Marital status: Single    Spouse name: Not on file   Number of children: Not on file   Years of education: Not on file   Highest education level: Not on file  Occupational History   Not on file  Tobacco Use   Smoking status: Never    Passive exposure: Never   Smokeless tobacco: Never  Substance and Sexual Activity   Alcohol use: Yes    Alcohol/week: 1.0 standard drink    Types: 1 Cans of beer per week   Drug use: Never   Sexual activity: Yes    Birth control/protection: None  Other Topics Concern   Not on file  Social History Narrative   Not on file   Social Determinants of Health   Financial Resource Strain: Not on file  Food Insecurity: Not on file  Transportation Needs: Not on file  Physical Activity: Not on file  Stress: Not on file  Social Connections: Not on file  Intimate Partner Violence: Not on file    FAMILY HISTORY: No family history on file.  ALLERGIES:  is allergic to prednisone.  MEDICATIONS:  No current outpatient medications on file.   No current facility-administered medications for this visit.    REVIEW OF SYSTEMS:   Constitutional: Denies fevers, chills or abnormal night sweats Eyes: Denies blurriness of vision, double vision or watery  eyes Ears, nose, mouth, throat, and face: Denies mucositis or sore throat Respiratory: Denies cough, dyspnea or wheezes Cardiovascular: Denies palpitation, chest discomfort or lower extremity swelling Gastrointestinal:  Denies nausea, heartburn or change in bowel habits Skin: Denies abnormal skin rashes Lymphatics: Denies new lymphadenopathy or easy bruising Neurological:Denies numbness, tingling or new weaknesses Behavioral/Psych: Mood is  stable, no new changes  Breast: Denies any palpable lumps or discharge All other systems were reviewed with the patient and are negative.  PHYSICAL EXAMINATION: ECOG PERFORMANCE STATUS: 0 - Asymptomatic  Vitals:   07/15/21 0856  BP: 123/87  Pulse: 68  Resp: 18  Temp: 97.8 F (36.6 C)  SpO2: 100%   Filed Weights   07/15/21 0856  Weight: 244 lb 12.8 oz (111 kg)    GENERAL:alert, no distress and comfortable SKIN: skin color, texture, turgor are normal, no rashes or significant lesions EYES: normal, conjunctiva are pink and non-injected, sclera clear OROPHARYNX:no exudate, no erythema and lips, buccal mucosa, and tongue normal  NECK: supple, thyroid normal size, non-tender, without nodularity LYMPH:  no palpable lymphadenopathy in the cervical, axillary or inguinal LUNGS: clear to auscultation and percussion with normal breathing effort HEART: regular rate & rhythm and no murmurs and no lower extremity edema ABDOMEN:abdomen soft, non-tender and normal bowel sounds Musculoskeletal:no cyanosis of digits and no clubbing  PSYCH: alert & oriented x 3 with fluent speech NEURO: no focal motor/sensory deficits BREAST: Small palpable mass at the area of biopsy which could be a postbiopsy hematoma.  No palpable regional adenopathy of the left breast  LABORATORY DATA:  I have reviewed the data as listed Lab Results  Component Value Date   WBC 5.8 07/15/2021   HGB 12.0 07/15/2021   HCT 36.6 07/15/2021   MCV 97.3 07/15/2021   PLT 279 07/15/2021   Lab Results  Component Value Date   NA 137 07/15/2021   K 4.0 07/15/2021   CL 105 07/15/2021   CO2 28 07/15/2021    RADIOGRAPHIC STUDIES: I have personally reviewed the radiological reports and agreed with the findings in the report.  ASSESSMENT AND PLAN:  Malignant neoplasm of upper-inner quadrant of left breast in female, estrogen receptor positive ([place]) This is a very pleasant 51 year old postmenopausal female patient with no  significant past medical history with newly diagnosed left breast invasive ductal carcinoma, grade 2 out of 3, ER +90% strong staining PR 95% strong staining, Ki-67 of 60% and HER2 equivocal by IHC and FISH is negative according to verbal report.  Since this is HER2 negative, we have discussed about Oncotype testing today and the following details. We have discussed about Oncotype Dx score which is a well validated prognostic scoring system which can predict outcome with endocrine therapy alone and whether chemotherapy reduces recurrence.  Typically in patients with ER positive cancers that are node negative if the RS score is high typically greater than or equal to 26, chemotherapy is recommended.  In women with intermediate recurrence score younger than 70, there can still be some role for chemotherapy in addition to endocrine therapy especially if the recurrence score is between 21-25. If chemotherapy is needed, this will precede radiation and then after radiation she will continue on antiestrogen therapy.  We have also discussed the following details about antiestrogen therapy. With regards to Tamoxifen, we discussed that this is a SERM, selective estrogen receptor modulator. We discussed mechanism of action of Tamoxifen, adverse effects on Tamoxifen including but not limited to post menopausal symptoms, increased risk of DVT/PE,  increased risk of endometrial cancer, questionable cataracts with long term use and increased risk of cardiovascular events in the study which was not statistically significant. A benefit from Tamoxifen would be improvement in bone density. With regards to aromatase inhibitors, we discussed mechanism of action, adverse effects including but not limited to post menopausal symptoms, arthralgias, myalgias, increased risk of cardiovascular events and bone loss.   She has not had any postmenopausal symptoms.  She does not have any preference for antiestrogen therapy but we will talk  to her about it again after surgery and review final recommendations.\  y   Total time spent: 60 minutes including history, review of records, counseling and coordination of care  All questions were answered. The patient knows to call the clinic with any problems, questions or concerns.    Benay Pike, MD 07/15/21

## 2021-07-15 NOTE — Assessment & Plan Note (Addendum)
This is a very pleasant 50 year old postmenopausal female patient with no significant past medical history with newly diagnosed left breast invasive ductal carcinoma, grade 2 out of 3, ER +90% strong staining PR 95% strong staining, Ki-67 of 60% and HER2 equivocal by IHC and FISH is negative according to verbal report.  Since this is HER2 negative, we have discussed about Oncotype testing today and the following details. We have discussed about Oncotype Dx score which is a well validated prognostic scoring system which can predict outcome with endocrine therapy alone and whether chemotherapy reduces recurrence.  Typically in patients with ER positive cancers that are node negative if the RS score is high typically greater than or equal to 26, chemotherapy is recommended.  In women with intermediate recurrence score younger than 66, there can still be some role for chemotherapy in addition to endocrine therapy especially if the recurrence score is between 21-25. If chemotherapy is needed, this will precede radiation and then after radiation she will continue on antiestrogen therapy.  We have also discussed the following details about antiestrogen therapy. With regards to Tamoxifen, we discussed that this is a SERM, selective estrogen receptor modulator. We discussed mechanism of action of Tamoxifen, adverse effects on Tamoxifen including but not limited to post menopausal symptoms, increased risk of DVT/PE, increased risk of endometrial cancer, questionable cataracts with long term use and increased risk of cardiovascular events in the study which was not statistically significant. A benefit from Tamoxifen would be improvement in bone density. With regards to aromatase inhibitors, we discussed mechanism of action, adverse effects including but not limited to post menopausal symptoms, arthralgias, myalgias, increased risk of cardiovascular events and bone loss.   She has not had any postmenopausal symptoms.   She does not have any preference for antiestrogen therapy but we will talk to her about it again after surgery and review final recommendations.\  y

## 2021-07-15 NOTE — Progress Notes (Signed)
[facility] Psychosocial Distress Screening Spiritual Care  Met with Amanda Ayers and her eldest and youngest daughters (middle daughter had to work) in [facility] to introduce Support Center team/resources, reviewing distress screen per protocol.  The patient scored a  0  on the Psychosocial Distress Thermometer which indicates  minimal  distress. Also assessed for distress and other psychosocial needs.    07/15/2021  ONCBCN DISTRESS SCREENING   Screening Type Initial Screening   Distress experienced in past week (1-10) 0   Referral to support programs Yes    Amanda Ayers preferred to meet with chaplain as representative of [facility] this morning. She was in good spirits, noting that meeting team and learning details about diagnosis and treatment helped resolve the unknown and reduce distress.   Provided pastoral presence, empathic listening, affirmation of strengths, and introduction to [facility] support programming. Also provided prayer per request.  Follow up needed: Yes.  [name] requests a follow-up call in ca two weeks.   Chaplain Lisa Lundeen, MDiv, [facility] Pager [phone] Voicemail [phone]       

## 2021-07-15 NOTE — Research (Signed)
[employer] 2021-05 - Specimen Collection Study to Evaluate Biomarkers in Subjects with Cancer    This Nurse has reviewed this patient's inclusion and exclusion criteria as a second review and confirms Amanda Ayers is eligible for study participation.  Patient may continue with enrollment.   [name] Deerica Waszak, RN, BSN, [facility]  Her  [facility] Clinical Research Nurse [place] [phone]  Pager [phone] 07/15/2021 12:05 PM

## 2021-07-15 NOTE — Therapy (Signed)
OUTPATIENT PHYSICAL THERAPY BREAST CANCER BASELINE EVALUATION   Patient Name: Amanda Ayers MRN: [id number] DOB:[birth year], 51 y.o., female Today's Date: 07/15/2021   PT End of Session - 07/15/21 1043     Visit Number 1    Number of Visits 2    Date for PT Re-Evaluation 09/09/21    PT Start Time 0942    PT Stop Time 1009    PT Time Calculation (min) 27 min    Activity Tolerance Patient tolerated treatment well    Behavior During Therapy [facility] for tasks assessed/performed             Past Medical History:  Diagnosis Date   Malignant neoplasm of upper-inner quadrant of left breast in female, estrogen receptor positive ([place]) 07/14/2021   History reviewed. No pertinent surgical history. Patient Active Problem List   Diagnosis Date Noted   Malignant neoplasm of upper-inner quadrant of left breast in female, estrogen receptor positive ([place]) 07/14/2021   Obesity (BMI 35.0-39.9 without comorbidity) 06/05/2021   Routine general medical examination at a health care facility 06/05/2021    PCP: Dr. Pricilla Holm  REFERRING PROVIDER: Dr. Stark Klein  REFERRING DIAG: Left breast cancer  THERAPY DIAG:  Malignant neoplasm of upper-inner quadrant of left breast in female, estrogen receptor positive ([place])  Abnormal posture  Rationale for Evaluation and Treatment Rehabilitation  ONSET DATE: 06/19/2021  SUBJECTIVE                                                                                                                                                                                           SUBJECTIVE STATEMENT: Patient reports she is here today to be seen by her medical team for her newly diagnosed left breast cancer.   PERTINENT HISTORY:  Patient was diagnosed on 06/19/2021 with left grade 2 invasive ductal carcinoma breast cancer. It measures 9 mm and is located in the upper inner quadrant. It is ER/PR positive and HER2 negative with a Ki67 of 60%.   PATIENT GOALS    reduce lymphedema risk and learn post op HEP.   PAIN:  Are you having pain? No   PRECAUTIONS: Active CA   HAND DOMINANCE: right  WEIGHT BEARING RESTRICTIONS No  FALLS:  Has patient fallen in last 6 months? No  LIVING ENVIRONMENT: Patient lives with: her boyfriend Lives in: House/apartment Has following equipment at home: None  OCCUPATION: full time; builds gas pumps  LEISURE: She walks some but not regularly  PRIOR LEVEL OF FUNCTION: Independent   OBJECTIVE  COGNITION:  Overall cognitive status: Within functional limits for tasks assessed    POSTURE:  Forward head and  rounded shoulders posture  UPPER EXTREMITY AROM/PROM:  A/PROM RIGHT   eval   Shoulder extension 51  Shoulder flexion 156  Shoulder abduction 174  Shoulder internal rotation 78  Shoulder external rotation 82    (Blank rows = not tested)  A/PROM LEFT   eval  Shoulder extension 41  Shoulder flexion 145  Shoulder abduction 176  Shoulder internal rotation 67  Shoulder external rotation 87    (Blank rows = not tested)   CERVICAL AROM: All within normal limits  UPPER EXTREMITY STRENGTH: WNL   LYMPHEDEMA ASSESSMENTS:   LANDMARK RIGHT   eval  10 cm proximal to olecranon process 37.2  Olecranon process 28.1  10 cm proximal to ulnar styloid process 24  Just proximal to ulnar styloid process 17.5  Across hand at thumb web space 19.4  At base of 2nd digit 6.5  (Blank rows = not tested)  LANDMARK LEFT   eval  10 cm proximal to olecranon process 36.5  Olecranon process 27.6  10 cm proximal to ulnar styloid process 22.2  Just proximal to ulnar styloid process 16.6  Across hand at thumb web space 19  At base of 2nd digit 6  (Blank rows = not tested)   L-DEX LYMPHEDEMA SCREENING:  The patient was assessed using the L-Dex machine today to produce a lymphedema index baseline score. The patient will be reassessed on a regular basis (typically every 3 months) to obtain new L-Dex scores.  If the score is > 6.5 points away from his/her baseline score indicating onset of subclinical lymphedema, it will be recommended to wear a compression garment for 4 weeks, 12 hours per day and then be reassessed. If the score continues to be > 6.5 points from baseline at reassessment, we will initiate lymphedema treatment. Assessing in this manner has a 95% rate of preventing clinically significant lymphedema.   L-DEX FLOWSHEETS - 07/15/21 1000       L-DEX LYMPHEDEMA SCREENING   Measurement Type Unilateral    L-DEX MEASUREMENT EXTREMITY Upper Extremity    POSITION  Standing    DOMINANT SIDE Right    At Risk Side Left    BASELINE SCORE (UNILATERAL) 2              QUICK DASH SURVEY:   PATIENT EDUCATION:  Education details: Lymphedema risk reduction and post op shoulder/posture HEP Person educated: Patient Education method: Explanation, Demonstration, Handout Education comprehension: Patient verbalized understanding and returned demonstration   HOME EXERCISE PROGRAM: Patient was instructed today in a home exercise program today for post op shoulder range of motion. These included active assist shoulder flexion in sitting, scapular retraction, wall walking with shoulder abduction, and hands behind head external rotation.  She was encouraged to do these twice a day, holding 3 seconds and repeating 5 times when permitted by her physician.   ASSESSMENT:  CLINICAL IMPRESSION: Patient was diagnosed on 06/19/2021 with left grade 2 invasive ductal carcinoma breast cancer. It measures 9 mm and is located in the upper inner quadrant. It is ER/PR positive and HER2 negative with a Ki67 of 60%. Her multidisciplinary medical team met prior to her assessments to determine a recommended treatment plan. She is planning to have a left lumpectomy and sentinel node biopsy followed by Oncotype testing, radiation, and anti-estrogen therapy. She will benefit from a post op PT reassessment to determine needs  and from L-Dex screens every 3 months for 2 years to detect subclinical lymphedema.  Pt will benefit from skilled therapeutic intervention  to improve on the following deficits: Decreased knowledge of precautions, impaired UE functional use, pain, decreased ROM, postural dysfunction.   PT treatment/interventions: ADL/self-care home management, pt/family education, therapeutic exercise  REHAB POTENTIAL: Excellent  CLINICAL DECISION MAKING: Stable/uncomplicated  EVALUATION COMPLEXITY: Low   GOALS: Goals reviewed with patient? YES  LONG TERM GOALS: (STG=LTG)    Name Target Date Goal status  1 Pt will be able to verbalize understanding of pertinent lymphedema risk reduction practices relevant to her dx specifically related to skin care.  Baseline:  No knowledge 07/15/2021 Achieved at eval  2 Pt will be able to return demo and/or verbalize understanding of the post op HEP related to regaining shoulder ROM. Baseline:  No knowledge 07/15/2021 Achieved at eval  3 Pt will be able to verbalize understanding of the importance of attending the post op After Breast CA Class for further lymphedema risk reduction education and therapeutic exercise.  Baseline:  No knowledge 07/15/2021 Achieved at eval  4 Pt will demo she has regained full shoulder ROM and function post operatively compared to baselines.  Baseline: See objective measurements taken today. 09/09/2021      PLAN: PT FREQUENCY/DURATION: EVAL and 1 follow up appointment.   PLAN FOR NEXT SESSION: will reassess 3-4 weeks post op to determine needs.   Patient will follow up at outpatient cancer rehab 3-4 weeks following surgery.  If the patient requires physical therapy at that time, a specific plan will be dictated and sent to the referring physician for approval. The patient was educated today on appropriate basic range of motion exercises to begin post operatively and the importance of attending the After Breast Cancer class following  surgery.  Patient was educated today on lymphedema risk reduction practices as it pertains to recommendations that will benefit the patient immediately following surgery.  She verbalized good understanding.    Physical Therapy Information for After Breast Cancer Surgery/Treatment:  Lymphedema is a swelling condition that you may be at risk for in your arm if you have lymph nodes removed from the armpit area.  After a sentinel node biopsy, the risk is approximately 5-9% and is higher after an axillary node dissection.  There is treatment available for this condition and it is not life-threatening.  Contact your physician or physical therapist with concerns. You may begin the 4 shoulder/posture exercises (see additional sheet) when permitted by your physician (typically a week after surgery).  If you have drains, you may need to wait until those are removed before beginning range of motion exercises.  A general recommendation is to not lift your arms above shoulder height until drains are removed.  These exercises should be done to your tolerance and gently.  This is not a "no pain/no gain" type of recovery so listen to your body and stretch into the range of motion that you can tolerate, stopping if you have pain.  If you are having immediate reconstruction, ask your plastic surgeon about doing exercises as he or she may want you to wait. We encourage you to attend the free one time ABC (After Breast Cancer) class offered by [facility].  You will learn information related to lymphedema risk, prevention and treatment and additional exercises to regain mobility following surgery.  You can call [phone] for more information.  This is offered the 1st and 3rd Monday of each month.  You only attend the class one time. While undergoing any medical procedure or treatment, try to avoid blood pressure being taken  or needle sticks from occurring on the arm on the side of cancer.   This  recommendation begins after surgery and continues for the rest of your life.  This may help reduce your risk of getting lymphedema (swelling in your arm). An excellent resource for those seeking information on lymphedema is the National Lymphedema Network's web site. It can be accessed at [place].org If you notice swelling in your hand, arm or breast at any time following surgery (even if it is many years from now), please contact your doctor or physical therapist to discuss this.  Lymphedema can be treated at any time but it is easier for you if it is treated early on.  If you feel like your shoulder motion is not returning to normal in a reasonable amount of time, please contact your surgeon or physical therapist.  [place] [phone]. [address], [place] [address]  ABC CLASS After Breast Cancer Class  After Breast Cancer Class is a specially designed exercise class to assist you in a safe recover after having breast cancer surgery.  In this class you will learn how to get back to full function whether your drains were just removed or if you had surgery a month ago.  This one-time class is held the 1st and 3rd Monday of every month from 11:00 a.m. until 12:00 noon virtually.  This class is FREE and space is limited. For more information or to register for the next available class, call [phone].  Class Goals  Understand specific stretches to improve the flexibility of you chest and shoulder. Learn ways to safely strengthen your upper body and improve your posture. Understand the warning signs of infection and why you may be at risk for an arm infection. Learn about Lymphedema and prevention.  ** You do not attend this class until after surgery.  Drains must be removed to participate  Patient was instructed today in a home exercise program today for post op shoulder range of motion. These included active assist shoulder  flexion in sitting, scapular retraction, wall walking with shoulder abduction, and hands behind head external rotation.  She was encouraged to do these twice a day, holding 3 seconds and repeating 5 times when permitted by her physician.   Annia Friendly, [place] 07/15/21 11:12 AM

## 2021-07-15 NOTE — Research (Signed)
Trial: [employer] 2021-05 - Specimen Collection Study to Evaluate Biomarkers in Subjects with Cancer    Patient Amanda Ayers was identified by Dr. Chryl Heck as a potential candidate for the above listed study.  This Clinical Research Nurse met with Amanda Ayers, [id number], on 07/15/21 in a manner and location that ensures patient privacy to discuss participation in the above listed research study.  Patient is Accompanied by 2 daughters .  A copy of the informed consent document with embedded HIPAA language was provided to the patient.  Patient reads, speaks, and understands [place].    Patient was provided with the business card of this Nurse and encouraged to contact the research team with any questions.  Patient was provided the option of taking informed consent documents home to review and was encouraged to review at their convenience with their support network, including other care providers. Patient is comfortable with making a decision regarding study participation today.  As outlined in the informed consent form, this Nurse and Amanda Ayers discussed the purpose of the research study, the investigational nature of the study, study procedures and requirements for study participation, potential risks and benefits of study participation, as well as alternatives to participation. This study is not blinded. The patient understands participation is voluntary and they may withdraw from study participation at any time.  This study does not involve randomization.  This study does not involve an investigational drug or device. This study does not involve a placebo. Patient understands enrollment is pending full eligibility review.   Confidentiality and how the patient's information will be used as part of study participation were discussed.  Patient was informed there is reimbursement provided for their time and effort spent on trial participation.  The patient is encouraged to discuss research  study participation with their insurance provider to determine what costs they may incur as part of study participation, including research related injury.    All questions were answered to patient's satisfaction.  The informed consent with embedded HIPAA language was reviewed page by page.  The patient's mental and emotional status is appropriate to provide informed consent, and the patient verbalizes an understanding of study participation.  Patient has agreed to participate in the above listed research study and has voluntarily signed the informed consent version Approved 29 Feb 2020, Revised 16 Mar 2021 with embedded HIPPA on 07/15/21 at 12:00PM.  The patient was provided with a copy of the signed informed consent form with embedded HIPAA language for their reference.  No study specific procedures were obtained prior to the signing of the informed consent document.  Approximately 15 minutes were spent with the patient reviewing the informed consent documents.  Patient was not requested to complete a Release of Information form.    Eligibility: Eligibility criteria reviewed with patient. This Nurse has reviewed this patient's inclusion and exclusion criteria and confirmed patient is eligible for study participation. Eligibility confirmed by treating investigator, who also agrees that patient should proceed with enrollment. Patient will continue with enrollment.  Blood Collection: Research blood obtained by [facility] venipuncture. Patient tolerated well without any adverse events.  Interviewed patient for data collection:  Medical History:  High Blood Pressure  No Coronary Artery Disease No Lupus    No Rheumatoid Arthritis  No Diabetes   No      If yes, which type?       Lynch Syndrome  No  Is the patient currently taking a magnesium supplement?   No  Does  the patient have a personal history of cancer (greater than 5 years ago)?  No  Does the patient have a family history of cancer in 1st or  2nd degree relatives? Yes If yes, Relationship(s) and Cancer type(s)? Grandmother -Endometrial cancer, Aunt- Lung Cancer, and Uncle - Multiple Myeloma  Does the patient have history of alcohol consumption? Yes   If yes, current or former? Former If former, year stopped? 2023 Number of years? 5 Drinks per week? 2  Does the patient have history of cigarette, cigar, pipe, or chewing tobacco use?  No   Gift Card: $50 gift card given to patient for her participation in this study.    Foye Spurling, BSN, RN, [place] Nurse II 07/15/2021

## 2021-07-16 ENCOUNTER — Encounter: Payer: Self-pay | Admitting: Genetic Counselor

## 2021-07-16 NOTE — Progress Notes (Signed)
REFERRING PROVIDER: Benay Pike, MD [place],  [place] [address]  PRIMARY PROVIDER:  Hoyt Koch, MD  PRIMARY REASON FOR VISIT:  1. Malignant neoplasm of upper-inner quadrant of left breast in female, estrogen receptor positive ([place])     HISTORY OF PRESENT ILLNESS:   Amanda Ayers, a 51 y.o. female, was seen for a  cancer genetics consultation during the breast multidisciplinary clinic at the request of Dr. Chryl Heck due to a personal history of breast cancer.  Amanda Ayers presents to clinic today to discuss the possibility of a hereditary predisposition to cancer, to discuss genetic testing, and to further clarify her future cancer risks, as well as potential cancer risks for family members.   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 The preliminary treatment plan is pending.    CANCER HISTORY:  Oncology History  Malignant neoplasm of upper-inner quadrant of left breast in female, estrogen receptor positive ([place])  06/19/2021 Mammogram   Mammogram from Jun 19, 2021 showed possible asymmetry in the left breast.  Diagnostic mammogram showed suspicious mass in the 10:00 location of the left breast.  Ultrasound of the left axilla negative for lymphadenopathy   07/14/2021 Initial Diagnosis   Malignant neoplasm of upper-inner quadrant of left breast in female, estrogen receptor positive [facility])    Pathology Results   Pathology from the left breast showed invasive ductal carcinoma grade 2 out of 3.  Prognostic showed ER 90% positive strong staining, PR 95% strong staining, Ki-67 of 60% and HER2 equivocal by IHC and FISH is pending      RISK FACTORS:  Mammogram within the last year: yes Colonoscopy: no; not examined. Menarche was at age 44.  First live birth at age 72.  OCP use for approximately 0 years.  HRT use: 0 years.  Past Medical History:  Diagnosis Date   Malignant  neoplasm of upper-inner quadrant of left breast in female, estrogen receptor positive ([place]) 07/14/2021     FAMILY HISTORY:  We obtained a detailed, 4-generation family history.  Significant diagnoses are listed below: Family History  Problem Relation Age of Onset   Multiple myeloma Maternal Uncle        d. 68s   Uterine cancer Maternal Grandmother        dx after age 5    Amanda Ayers is unaware of previous family history of genetic testing for hereditary cancer risks.  There is no reported Ashkenazi Jewish ancestry. There is no known consanguinity.  GENETIC COUNSELING ASSESSMENT: Amanda Ayers is a 51 y.o. female with a personal history of cancer which is somewhat suggestive of a hereditary cancer syndrome and predisposition to cancer given her age of diagnosis. We, therefore, discussed and recommended the following at today's visit.   DISCUSSION: We discussed that, in general, most cancer is not inherited in families, but instead is sporadic or familial. Sporadic cancers occur by chance and typically happen at older ages (>50 years) as this type of cancer is caused by genetic changes acquired during an individual's lifetime. Some families have more cancers than would be expected by chance; however, the ages or types of cancer are not consistent with a known genetic mutation or known genetic mutations have been ruled out. This type of familial cancer is thought to be due to a combination of multiple genetic, environmental, hormonal, and lifestyle factors. While this combination of factors likely increases the risk  of cancer, the exact source of this risk is not currently identifiable or testable.    We discussed that approximately 5-10% of cancer is hereditary, meaning that it is due to a mutation in a single gene that is passed down from generation to generation in a family. Most hereditary cases of hereditary breast cancer are associated with mutations in BRCA1/2. There are other genes that can be  associated an increased risk for breast cancer. We discussed that testing can be beneficial for several reasons, including knowing about other cancer risks, identifying potential screening and risk-reduction options that may be appropriate, and to understand if other family members could be at risk for cancer and allow them to undergo genetic testing.  We discussed with Amanda Ayers that the personal and family history does not meet insurance or NCCN criteria for genetic testing and, therefore, is not highly consistent with a familial hereditary cancer syndrome.  We discussed that genetic testing is still available if she wishes to proceed given her age of diagnosis.   PLAN:  Amanda Ayers did not wish to pursue genetic testing at today's visit. We understand this decision and remain available to coordinate genetic testing at any time in the future. We, therefore, recommend Amanda Ayers continue to follow the cancer screening guidelines given by her primary healthcare provider.  We discussed that others in the family may have an increased chance of breast cancer given the family history.  We discussed that her family members, such as her daughters, should notify their providers of the family history of breast cancer.   Lastly, we encouraged Amanda Ayers to remain in contact with cancer genetics annually so that we can continuously update the family history and inform her of any changes in cancer genetics and testing that may be of benefit for this family.   Amanda Ayers questions were answered to her satisfaction today. Our contact information was provided should additional questions or concerns arise. Thank you for the referral and allowing [place] to share in the care of your patient.   Darryll Raju M. Joette Catching, [place], [facility] Genetic Counselor [email or web address] (P) [phone]  The patient was seen for a total of 10 minutes in face-to-face genetic counseling.  The patient was accompanied by her daughters, T'Kara and  20.  Drs. Lindi Adie and/or Burr Medico were available to discuss this case as needed.    _______________________________________________________________________ For Office Staff:  Number of people involved in session: 3 Was an Intern/ student involved with case: no

## 2021-07-20 ENCOUNTER — Telehealth: Payer: Self-pay | Admitting: *Deleted

## 2021-07-20 ENCOUNTER — Encounter: Payer: Self-pay | Admitting: *Deleted

## 2021-07-20 ENCOUNTER — Other Ambulatory Visit: Payer: Self-pay | Admitting: General Surgery

## 2021-07-20 ENCOUNTER — Other Ambulatory Visit: Payer: Self-pay

## 2021-07-20 ENCOUNTER — Encounter (HOSPITAL_BASED_OUTPATIENT_CLINIC_OR_DEPARTMENT_OTHER): Payer: Self-pay | Admitting: General Surgery

## 2021-07-20 DIAGNOSIS — Z170 Estrogen receptor positive status [ER+]: Secondary | ICD-10-CM

## 2021-07-20 MED ORDER — CHLORHEXIDINE GLUCONATE CLOTH 2 % EX PADS: 6.0000 | MEDICATED_PAD | Freq: Once | CUTANEOUS | Status: DC

## 2021-07-20 NOTE — H&P (Signed)
REFERRING PHYSICIAN:  Enriqueta Shutter   PROVIDER:  Georgianne Fick, MD   Care Team: Patient Care Team: Charna Busman, MD as PCP - General (Internal Medicine) Barry Dienes Ballard Russell, MD as Consulting Provider (Surgical Oncology) Marye Round, MD (Radiation Oncology) Iruku, Flo Shanks, MD (Hematology and Oncology)    MRN: [id number] DOB: [birth year] DATE OF ENCOUNTER: 07/15/2021   Subjective    Chief Complaint: Breast Cancer   History of Present Illness: Amanda Ayers is a 51 y.o. female who is seen today as an office consultation at the request of Dr. Chryl Heck for evaluation of Breast Cancer   Patient has a new diagnosis of left breast cancer May 2023.  She is a lovely 51 year old female who presents with screening detected left breast asymmetry.  Diagnostic imaging was then performed which showed a 9 mm mass at 10:00 on the left.  The axilla was negative.  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   She has no prior cancer diagnoses before this and has no family history of cancer.  She had menarche at age 74.  Menopause was age 70.  She has not use any hormone replacement.  She is a G3 P3 with her first child at age 65.  She is accompanied by her youngest and oldest daughters.  She has not used hormonal contraception.  She has not ever had a bone density or colonoscopy.   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   FINDINGS: Additional 2-D and 3-D images are performed. These views confirm presence of a spiculated mass in the MEDIAL aspect of the LEFT breast.   On physical exam, I palpate no mass in the MEDIAL aspect of the LEFT breast.   Targeted ultrasound is performed,  showing a spiculated irregular mass with indistinct and spiculated margins in the 10 o'clock location of the LEFT breast 20 centimeters from the nipple. No internal blood flow identified with Doppler evaluation. Mass measures 0.9 x 0.5 x 0.6 centimeters and is taller than wide.   Evaluation of the LEFT axilla is negative for adenopathy.   IMPRESSION: Suspicious mass in the 10 o'clock location of the LEFT breast.   RECOMMENDATION: Ultrasound-guided core biopsy of LEFT breast mass.   I have discussed the findings and recommendations with the patient. If applicable, a reminder letter will be sent to the patient regarding the next appointment.   BI-RADS CATEGORY  4: Suspicious.     Pathology core needle biopsy: 07/06/2021 Breast, left, needle core biopsy, 10:00, 20 cmfn - INVASIVE DUCTAL CARCINOMA Based on the biopsy, the carcinoma appears Nottingham grade 2 of 3 and measures 0.6 cm in greatest linear extent.   Receptors: Estrogen Receptor: 95%, POSITIVE, STRONG STAINING INTENSITY Progesterone Receptor: 95%, POSITIVE, STRONG STAINING INTENSITY Proliferation Marker Ki67: 60% Her 2 negative     Review of Systems: A complete review of systems was obtained from the patient.  I have reviewed this information and discussed as appropriate with the patient.  See HPI as well for other ROS.       Medical History: Past Medical History  No past medical history on file.        Patient Active Problem List  Diagnosis   Leg swelling  Malignant neoplasm of upper-inner quadrant of left breast in female, estrogen receptor positive (CMS-HCC)   Obesity (BMI 35.0-39.9 without comorbidity), unspecified      Past Surgical History  No past surgical history on file.      Allergies      Allergies  Allergen Reactions   Prednisone Swelling              Current Outpatient Medications on File Prior to Visit  Medication Sig Dispense Refill   naltrexone (REVIA) 50 mg tablet Take 0.5  tablets by mouth once daily       buPROPion (WELLBUTRIN XL) 150 MG XL tablet Take 150 mg by mouth once daily        No current facility-administered medications on file prior to visit.      Family History       Family History  Problem Relation Age of Onset   High blood pressure (Hypertension) Father          Social History       Tobacco Use  Smoking Status Never  Smokeless Tobacco Never      Social History  Social History         Socioeconomic History   Marital status: Unknown  Tobacco Use   Smoking status: Never   Smokeless tobacco: Never  Vaping Use   Vaping Use: Never used  Substance and Sexual Activity   Alcohol use: Yes      Comment: Social   Drug use: Never   Sexual activity: Defer        Objective:         Vitals:    07/15/21 1227  BP: 123/87  Pulse: 68  Resp: 18  Temp: 36.6 C (97.8 F)  Weight: (!) 111 kg (244 lb 12.8 oz)  Height: 172.7 cm ('5\' 8"' )    Body mass index is 37.22 kg/m.   Gen:  No acute distress.  Well nourished and well groomed.   Neurological: Alert and oriented to person, place, and time. Coordination normal.  Head: Normocephalic and atraumatic.  Eyes: Conjunctivae are normal. Pupils are equal, round, and reactive to light. No scleral icterus.  Neck: Normal range of motion. Neck supple. No tracheal deviation or thyromegaly present.  Cardiovascular: Normal rate, regular rhythm, normal heart sounds and intact distal pulses.  Exam reveals no gallop and no friction rub.  No murmur heard. Breast: ptotic and relatively symmetric.  No palpable masses.  No LAD. No skin dimpling.  No nipple retraction or nipple discharge.   Respiratory: Effort normal.  No respiratory distress. No chest wall tenderness. Breath sounds normal.  No wheezes, rales or rhonchi.  GI: Soft. Bowel sounds are normal. The abdomen is soft and nontender.  There is no rebound and no guarding.  Musculoskeletal: Normal range of motion. Extremities are nontender.   Lymphadenopathy: No cervical, preauricular, postauricular or axillary adenopathy is present Skin: Skin is warm and dry. No rash noted. No diaphoresis. No erythema. No pallor. No clubbing, cyanosis, or edema.   Psychiatric: Normal mood and affect. Behavior is normal. Judgment and thought content normal.      Labs CBC and CMET essentially normal 07/15/2021   Assessment and Plan:    Malignant neoplasm of upper-inner quadrant of left breast in female, estrogen receptor positive (CMS-HCC)  (primary encounter diagnosis)     Patient has a new diagnosis of clinical T1BN0 left breast cancer.  This is amenable to breast conservation.  We will plan to do a left-sided seed  localized lumpectomy with sentinel node biopsy.  This will be followed by oncotype, adjuvant radiation, and antihormonal treatment.   The surgical procedure was described to the patient.  I discussed the incision type and location and that we would need radiology involved on with a wire or seed marker and/or sentinel node.       The risks and benefits of the procedure were described to the patient and she wishes to proceed.     We discussed the risks bleeding, infection, damage to other structures, need for further procedures/surgeries.  We discussed the risk of seroma.  The patient was advised if the area in the breast in cancer, we may need to go back to surgery for additional tissue to obtain negative margins or for a lymph node biopsy. The patient was advised that these are the most common complications, but that others can occur as well.  They were advised against taking aspirin or other anti-inflammatory agents/blood thinners the week before surgery.     She would like to do this at the first available opportunity.     No follow-ups on file.     Milus Height, MD FACS Surgical Oncology, General Surgery, Trauma and [facility]

## 2021-07-20 NOTE — Telephone Encounter (Signed)
Spoke with patient to follow up from [facility] and assess navigation needs. Patient denies any questions or concerns at this time. Encouraged her to call should anything arise.Patient verbalized understanding.  

## 2021-07-20 NOTE — Progress Notes (Signed)

## 2021-07-21 ENCOUNTER — Telehealth: Payer: Self-pay | Admitting: Hematology and Oncology

## 2021-07-21 ENCOUNTER — Other Ambulatory Visit: Payer: Self-pay

## 2021-07-21 ENCOUNTER — Ambulatory Visit (HOSPITAL_BASED_OUTPATIENT_CLINIC_OR_DEPARTMENT_OTHER): Payer: BC Managed Care – PPO | Admitting: Anesthesiology

## 2021-07-21 ENCOUNTER — Encounter (HOSPITAL_BASED_OUTPATIENT_CLINIC_OR_DEPARTMENT_OTHER)
Admission: RE | Disposition: A | Discharge status: Home / Self Care | Payer: Self-pay | Source: Home / Self Care | Attending: General Surgery

## 2021-07-21 ENCOUNTER — Ambulatory Visit
Admission: RE | Admit: 2021-07-21 | Discharge: 2021-07-21 | Disposition: A | Discharge status: Home / Self Care | Payer: BC Managed Care – PPO | Source: Ambulatory Visit | Attending: General Surgery | Admitting: General Surgery

## 2021-07-21 ENCOUNTER — Ambulatory Visit (HOSPITAL_BASED_OUTPATIENT_CLINIC_OR_DEPARTMENT_OTHER)
Admission: RE | Admit: 2021-07-21 | Discharge: 2021-07-21 | Disposition: A | Discharge status: Home / Self Care | Payer: BC Managed Care – PPO | Attending: General Surgery | Admitting: General Surgery

## 2021-07-21 ENCOUNTER — Encounter (HOSPITAL_BASED_OUTPATIENT_CLINIC_OR_DEPARTMENT_OTHER): Payer: Self-pay | Admitting: General Surgery

## 2021-07-21 DIAGNOSIS — C50212 Malignant neoplasm of upper-inner quadrant of left female breast: Secondary | ICD-10-CM

## 2021-07-21 DIAGNOSIS — D0512 Intraductal carcinoma in situ of left breast: Secondary | ICD-10-CM | POA: Diagnosis present

## 2021-07-21 DIAGNOSIS — Z01818 Encounter for other preprocedural examination: Secondary | ICD-10-CM

## 2021-07-21 HISTORY — PX: BREAST LUMPECTOMY WITH RADIOACTIVE SEED AND SENTINEL LYMPH NODE BIOPSY: SHX6550

## 2021-07-21 HISTORY — DX: Unspecified osteoarthritis, unspecified site: M19.90

## 2021-07-21 LAB — POCT PREGNANCY, URINE: Preg Test, Ur: NEGATIVE

## 2021-07-21 SURGERY — BREAST LUMPECTOMY WITH RADIOACTIVE SEED AND SENTINEL LYMPH NODE BIOPSY
Anesthesia: General | Site: Breast | Laterality: Left

## 2021-07-21 MED ORDER — ACETAMINOPHEN 500 MG PO TABS
ORAL_TABLET | ORAL | Status: AC
  Filled 2021-07-21: qty 2

## 2021-07-21 MED ORDER — ROPIVACAINE HCL 5 MG/ML IJ SOLN
INTRAMUSCULAR | Status: DC | PRN
  Administered 2021-07-21: 30 mL via PERINEURAL

## 2021-07-21 MED ORDER — LIDOCAINE-EPINEPHRINE (PF) 1 %-1:200000 IJ SOLN
INTRAMUSCULAR | Status: AC
  Filled 2021-07-21: qty 30

## 2021-07-21 MED ORDER — 0.9 % SODIUM CHLORIDE (POUR BTL) OPTIME
TOPICAL | Status: DC | PRN
  Administered 2021-07-21: 400 mL

## 2021-07-21 MED ORDER — MIDAZOLAM HCL 2 MG/2ML IJ SOLN
INTRAMUSCULAR | Status: AC
  Filled 2021-07-21: qty 2

## 2021-07-21 MED ORDER — MIDAZOLAM HCL 2 MG/2ML IJ SOLN
1.0000 mg | Freq: Once | INTRAMUSCULAR | Status: AC
  Administered 2021-07-21: 1 mg via INTRAVENOUS

## 2021-07-21 MED ORDER — FENTANYL CITRATE (PF) 100 MCG/2ML IJ SOLN
INTRAMUSCULAR | Status: AC
  Filled 2021-07-21: qty 2

## 2021-07-21 MED ORDER — ONDANSETRON HCL 4 MG/2ML IJ SOLN
INTRAMUSCULAR | Status: DC | PRN
  Administered 2021-07-21: 4 mg via INTRAVENOUS

## 2021-07-21 MED ORDER — AMISULPRIDE (ANTIEMETIC) 5 MG/2ML IV SOLN: 10.0000 mg | Freq: Once | INTRAVENOUS | Status: DC | PRN

## 2021-07-21 MED ORDER — FENTANYL CITRATE (PF) 100 MCG/2ML IJ SOLN
INTRAMUSCULAR | Status: DC | PRN
  Administered 2021-07-21: 25 ug via INTRAVENOUS

## 2021-07-21 MED ORDER — MAGTRACE LYMPHATIC TRACER
INTRAMUSCULAR | Status: DC | PRN
  Administered 2021-07-21: 2 mL via INTRAMUSCULAR

## 2021-07-21 MED ORDER — ACETAMINOPHEN 500 MG PO TABS
1000.0000 mg | ORAL_TABLET | ORAL | Status: AC
  Administered 2021-07-21: 1000 mg via ORAL

## 2021-07-21 MED ORDER — PROPOFOL 10 MG/ML IV BOLUS
INTRAVENOUS | Status: DC | PRN
  Administered 2021-07-21: 100 mg via INTRAVENOUS
  Administered 2021-07-21: 200 mg via INTRAVENOUS

## 2021-07-21 MED ORDER — GABAPENTIN 300 MG PO CAPS
300.0000 mg | ORAL_CAPSULE | ORAL | Status: AC
  Administered 2021-07-21: 300 mg via ORAL

## 2021-07-21 MED ORDER — LIDOCAINE HCL (CARDIAC) PF 100 MG/5ML IV SOSY
PREFILLED_SYRINGE | INTRAVENOUS | Status: DC | PRN
  Administered 2021-07-21: 100 mg via INTRAVENOUS

## 2021-07-21 MED ORDER — LACTATED RINGERS IV SOLN: INTRAVENOUS | Status: DC

## 2021-07-21 MED ORDER — MIDAZOLAM HCL 5 MG/5ML IJ SOLN
INTRAMUSCULAR | Status: DC | PRN
  Administered 2021-07-21: 2 mg via INTRAVENOUS

## 2021-07-21 MED ORDER — FENTANYL CITRATE (PF) 100 MCG/2ML IJ SOLN
50.0000 ug | Freq: Once | INTRAMUSCULAR | Status: AC
  Administered 2021-07-21: 50 ug via INTRAVENOUS

## 2021-07-21 MED ORDER — BUPIVACAINE HCL (PF) 0.25 % IJ SOLN
INTRAMUSCULAR | Status: AC
  Filled 2021-07-21: qty 30

## 2021-07-21 MED ORDER — LIDOCAINE-EPINEPHRINE (PF) 1 %-1:200000 IJ SOLN
INTRAMUSCULAR | Status: DC | PRN
  Administered 2021-07-21: 7 mL via SUBCUTANEOUS

## 2021-07-21 MED ORDER — CLONIDINE HCL (ANALGESIA) 100 MCG/ML EP SOLN
EPIDURAL | Status: DC | PRN
  Administered 2021-07-21: 80 ug

## 2021-07-21 MED ORDER — EPHEDRINE SULFATE (PRESSORS) 50 MG/ML IJ SOLN
INTRAMUSCULAR | Status: DC | PRN
  Administered 2021-07-21: 10 mg via INTRAVENOUS

## 2021-07-21 MED ORDER — OXYCODONE HCL 5 MG PO TABS: 5.0000 mg | ORAL_TABLET | Freq: Once | ORAL | Status: DC | PRN

## 2021-07-21 MED ORDER — OXYCODONE HCL 5 MG/5ML PO SOLN: 5.0000 mg | Freq: Once | ORAL | Status: DC | PRN

## 2021-07-21 MED ORDER — PROPOFOL 10 MG/ML IV BOLUS
INTRAVENOUS | Status: AC
  Filled 2021-07-21: qty 20

## 2021-07-21 MED ORDER — CEFAZOLIN SODIUM-DEXTROSE 2-4 GM/100ML-% IV SOLN
INTRAVENOUS | Status: AC
  Filled 2021-07-21: qty 100

## 2021-07-21 MED ORDER — HYDROMORPHONE HCL 1 MG/ML IJ SOLN: 0.2500 mg | INTRAMUSCULAR | Status: DC | PRN

## 2021-07-21 MED ORDER — CEFAZOLIN SODIUM-DEXTROSE 2-4 GM/100ML-% IV SOLN
2.0000 g | INTRAVENOUS | Status: AC
  Administered 2021-07-21: 2 g via INTRAVENOUS

## 2021-07-21 MED ORDER — GABAPENTIN 300 MG PO CAPS
ORAL_CAPSULE | ORAL | Status: AC
  Filled 2021-07-21: qty 1

## 2021-07-21 MED ORDER — DEXAMETHASONE SODIUM PHOSPHATE 4 MG/ML IJ SOLN
INTRAMUSCULAR | Status: DC | PRN
  Administered 2021-07-21: 5 mg via PERINEURAL

## 2021-07-21 MED ORDER — HYDROCODONE-ACETAMINOPHEN 5-325 MG PO TABS: 1.0000 | ORAL_TABLET | Freq: Four times a day (QID) | ORAL | 0 refills | Status: DC | PRN

## 2021-07-21 SURGICAL SUPPLY — 71 items
ADH SKN CLS APL DERMABOND .7 (GAUZE/BANDAGES/DRESSINGS) ×1
APL PRP STRL LF DISP 70% ISPRP (MISCELLANEOUS) ×1
BINDER BREAST LRG (GAUZE/BANDAGES/DRESSINGS) IMPLANT
BINDER BREAST MEDIUM (GAUZE/BANDAGES/DRESSINGS) IMPLANT
BINDER BREAST XLRG (GAUZE/BANDAGES/DRESSINGS) IMPLANT
BINDER BREAST XXLRG (GAUZE/BANDAGES/DRESSINGS) IMPLANT
BLADE SURG 10 STRL SS (BLADE) ×2 IMPLANT
BLADE SURG 15 STRL LF DISP TIS (BLADE) ×1 IMPLANT
BLADE SURG 15 STRL SS (BLADE) ×2
BNDG COHESIVE 4X5 TAN ST LF (GAUZE/BANDAGES/DRESSINGS) ×2 IMPLANT
CANISTER SUC SOCK COL 7IN (MISCELLANEOUS) IMPLANT
CANISTER SUCT 1200ML W/VALVE (MISCELLANEOUS) ×2 IMPLANT
CHLORAPREP W/TINT 26 (MISCELLANEOUS) ×2 IMPLANT
CLIP TI LARGE 6 (CLIP) ×2 IMPLANT
CLIP TI MEDIUM 6 (CLIP) ×4 IMPLANT
CLIP TI WIDE RED SMALL 6 (CLIP) IMPLANT
COVER MAYO STAND STRL (DRAPES) ×3 IMPLANT
COVER PROBE W GEL 5X96 (DRAPES) ×3 IMPLANT
DERMABOND ADVANCED (GAUZE/BANDAGES/DRESSINGS) ×1
DERMABOND ADVANCED .7 DNX12 (GAUZE/BANDAGES/DRESSINGS) ×1 IMPLANT
DRAPE U-SHAPE 48X52 POLY STRL (PACKS) ×1 IMPLANT
DRAPE UTILITY XL STRL (DRAPES) ×2 IMPLANT
DRSG PAD ABDOMINAL 8X10 ST (GAUZE/BANDAGES/DRESSINGS) ×2 IMPLANT
ELECT COATED BLADE 2.86 ST (ELECTRODE) ×2 IMPLANT
ELECT REM PT RETURN 9FT ADLT (ELECTROSURGICAL) ×2
ELECTRODE REM PT RTRN 9FT ADLT (ELECTROSURGICAL) ×1 IMPLANT
GAUZE SPONGE 4X4 12PLY STRL LF (GAUZE/BANDAGES/DRESSINGS) ×1 IMPLANT
GLOVE BIO SURGEON STRL SZ 6 (GLOVE) ×2 IMPLANT
GLOVE BIO SURGEON STRL SZ 6.5 (GLOVE) ×2 IMPLANT
GLOVE BIOGEL PI IND STRL 6.5 (GLOVE) ×1 IMPLANT
GLOVE BIOGEL PI IND STRL 7.0 (GLOVE) IMPLANT
GLOVE BIOGEL PI INDICATOR 6.5 (GLOVE) ×2
GLOVE BIOGEL PI INDICATOR 7.0 (GLOVE) ×4
GOWN STRL REUS W/ TWL LRG LVL3 (GOWN DISPOSABLE) ×1 IMPLANT
GOWN STRL REUS W/TWL 2XL LVL3 (GOWN DISPOSABLE) ×2 IMPLANT
GOWN STRL REUS W/TWL LRG LVL3 (GOWN DISPOSABLE) ×4
KIT MARKER MARGIN INK (KITS) ×2 IMPLANT
LIGHT WAVEGUIDE WIDE FLAT (MISCELLANEOUS) ×1 IMPLANT
MARKER SKIN DUAL TIP RULER LAB (MISCELLANEOUS) ×1 IMPLANT
MAT PREVALON FULL STRYKER (MISCELLANEOUS) ×1 IMPLANT
NDL HYPO 25X1 1.5 SAFETY (NEEDLE) ×1 IMPLANT
NDL SAFETY ECLIPSE 18X1.5 (NEEDLE) ×1 IMPLANT
NEEDLE HYPO 18GX1.5 SHARP (NEEDLE)
NEEDLE HYPO 25X1 1.5 SAFETY (NEEDLE) ×4 IMPLANT
NS IRRIG 1000ML POUR BTL (IV SOLUTION) ×2 IMPLANT
PACK BASIN DAY SURGERY FS (CUSTOM PROCEDURE TRAY) ×2 IMPLANT
PACK UNIVERSAL I (CUSTOM PROCEDURE TRAY) ×2 IMPLANT
PENCIL SMOKE EVACUATOR (MISCELLANEOUS) ×2 IMPLANT
SLEEVE SCD COMPRESS KNEE MED (STOCKING) ×2 IMPLANT
SPIKE FLUID TRANSFER (MISCELLANEOUS) IMPLANT
SPONGE T-LAP 18X18 ~~LOC~~+RFID (SPONGE) ×3 IMPLANT
STAPLER VISISTAT 35W (STAPLE) IMPLANT
STOCKINETTE IMPERVIOUS LG (DRAPES) ×2 IMPLANT
STRIP CLOSURE SKIN 1/2X4 (GAUZE/BANDAGES/DRESSINGS) ×2 IMPLANT
SUT ETHILON 2 0 FS 18 (SUTURE) IMPLANT
SUT MNCRL AB 4-0 PS2 18 (SUTURE) ×3 IMPLANT
SUT MON AB 5-0 PS2 18 (SUTURE) IMPLANT
SUT SILK 2 0 SH (SUTURE) IMPLANT
SUT VIC AB 2-0 SH 27 (SUTURE) ×2
SUT VIC AB 2-0 SH 27XBRD (SUTURE) ×1 IMPLANT
SUT VIC AB 3-0 SH 27 (SUTURE)
SUT VIC AB 3-0 SH 27X BRD (SUTURE) ×1 IMPLANT
SUT VICRYL 3-0 CR8 SH (SUTURE) ×2 IMPLANT
SYR 3ML 23GX1 SAFETY (SYRINGE) ×2 IMPLANT
SYR BULB EAR ULCER 3OZ GRN STR (SYRINGE) ×1 IMPLANT
SYR CONTROL 10ML LL (SYRINGE) ×2 IMPLANT
TOWEL GREEN STERILE FF (TOWEL DISPOSABLE) ×2 IMPLANT
TRACER MAGTRACE VIAL (MISCELLANEOUS) ×1 IMPLANT
TRAY FAXITRON CT DISP (TRAY / TRAY PROCEDURE) ×2 IMPLANT
TUBE CONNECTING 20X1/4 (TUBING) ×2 IMPLANT
YANKAUER SUCT BULB TIP NO VENT (SUCTIONS) ×2 IMPLANT

## 2021-07-21 NOTE — Interval H&P Note (Signed)
History and Physical Interval Note:  07/21/2021 2:19 PM  Amanda Ayers  has presented today for surgery, with the diagnosis of LEFT BREAST CANCER.  The various methods of treatment have been discussed with the patient and family. After consideration of risks, benefits and other options for treatment, the patient has consented to  Procedure(s) with comments: LEFT BREAST LUMPECTOMY WITH RADIOACTIVE SEED AND SENTINEL LYMPH NODE BIOPSY (Left) - 90 MINUTES GEN COMBINED WITH REGIONAL FOR POST-OP PAIN as a surgical intervention.  The patient's history has been reviewed, patient examined, no change in status, stable for surgery.  I have reviewed the patient's chart and labs.  Questions were answered to the patient's satisfaction.     Stark Klein

## 2021-07-21 NOTE — Op Note (Signed)
Left Breast Radioactive seed localized lumpectomy and sentinel lymph node biopsy  Indications: This patient presents with history of left breast cancer, upper inner quadrant, grade 2 invasive ductal carcinoma, +/+/-, Ki 67 60%.  cT1bN0  Pre-operative Diagnosis: left breast cancer  Post-operative Diagnosis: Same  Surgeon: Stark Klein   Anesthesia: General endotracheal anesthesia  ASA Class: 2  Procedure Details  The patient was seen in the Holding Room. The risks, benefits, complications, treatment options, and expected outcomes were discussed with the patient. The possibilities of bleeding, infection, the need for additional procedures, failure to diagnose a condition, and creating a complication requiring transfusion or operation were discussed with the patient. The patient concurred with the proposed plan, giving informed consent.  The site of surgery properly noted/marked. The patient was taken to Operating Room # 1, identified, and the procedure verified as left Breast Seed localized Lumpectomy with sentinel lymph node biopsy. A Time Out was held and the above information confirmed.  The left arm, breast, and chest were prepped and draped in standard fashion. The lumpectomy was performed by creating a curvilinear transverse medially located incision near the previously placed radioactive seed.  Dissection was carried down to around the point of maximum signal intensity. The cautery was used to perform the dissection.  Hemostasis was achieved with cautery. The edges of the cavity were marked with large clips, with one each medial, lateral, inferior and superior, and two clips posteriorly.   The specimen was inked with the margin marker paint kit.    Specimen radiography confirmed inclusion of the mammographic lesion, the clip, and the seed.  The background signal in the breast was zero.  The wound was irrigated and closed with 3-0 vicryl in layers and 4-0 monocryl subcuticular suture.    Using  a sentimag probe, left axillary sentinel nodes were identified transcutaneously.  An oblique incision was created below the axillary hairline.  Dissection was carried through the clavipectoral fascia.  Two deep level 2 axillary sentinel nodes were removed.  Counts per second were 1321 and 150.    The background count was 10 cps.  The wound was irrigated.  Hemostasis was achieved with cautery.  The axillary incision was closed with a 3-0 vicryl deep dermal interrupted sutures and a 4-0 monocryl subcuticular closure.    Sterile dressings were applied. At the end of the operation, all sponge, instrument, and needle counts were correct.  Findings: grossly clear surgical margins and no palpable adenopathy, posterior margin is pectoralis, anterior margin is skin.   Estimated Blood Loss:  min         Specimens: left breast tissue with seed and two deep left axillary sentinel lymph nodes.             Complications:  None; patient tolerated the procedure well.         Disposition: PACU - hemodynamically stable.         Condition: stable

## 2021-07-21 NOTE — Discharge Instructions (Addendum)
[place] Office Phone Number [phone]  BREAST BIOPSY/ PARTIAL MASTECTOMY: POST OP INSTRUCTIONS  Always review your discharge instruction sheet given to you by the facility where your surgery was performed.  IF YOU HAVE DISABILITY OR FAMILY LEAVE FORMS, YOU MUST BRING THEM TO THE OFFICE FOR PROCESSING.  DO NOT GIVE THEM TO YOUR DOCTOR.  Take 2 tylenol (acetominophen) three times a day for 3 days.  If you still have pain, add ibuprofen with food in between if able to take this (if you have kidney issues or stomach issues, do not take ibuprofen).  If both of those are not enough, add the narcotic pain pill.  If you find you are needing a lot of this overnight after surgery, call the next morning for a refill.    Prescriptions will not be filled after 5pm or on week-ends. Take your usually prescribed medications unless otherwise directed You should eat very light the first 24 hours after surgery, such as soup, crackers, pudding, etc.  Resume your normal diet the day after surgery. Most patients will experience some swelling and bruising in the breast.  Ice packs and a good support bra will help.  Swelling and bruising can take several days to resolve.  It is common to experience some constipation if taking pain medication after surgery.  Increasing fluid intake and taking a stool softener will usually help or prevent this problem from occurring.  A mild laxative (Milk of Magnesia or Miralax) should be taken according to package directions if there are no bowel movements after 48 hours. Unless discharge instructions indicate otherwise, you may remove your bandages 48 hours after surgery, and you may shower at that time.  You may have steri-strips (small skin tapes) in place directly over the incision.  These strips should be left on the skin at least for for 7-10 days.    ACTIVITIES:  You may resume regular daily activities (gradually increasing) beginning the next day.  Wearing a  good support bra or sports bra (or the breast binder) minimizes pain and swelling.  You may have sexual intercourse when it is comfortable. No heavy lifting for 1-2 weeks (not over around 10 pounds).  You may drive when you no longer are taking prescription pain medication, you can comfortably wear a seatbelt, and you can safely maneuver your car and apply brakes. RETURN TO WORK:  __________3-14 days depending on job. _______________ Dennis Bast should see your doctor in the office for a follow-up appointment approximately two weeks after your surgery.  Your doctor's nurse will typically make your follow-up appointment when she calls you with your pathology report.  Expect your pathology report 3-4 business days after your surgery.  You may call to check if you do not hear from [place] after three days.   WHEN TO CALL YOUR DOCTOR: Fever over 101.0 Nausea and/or vomiting. Extreme swelling or bruising. Continued bleeding from incision. Increased pain, redness, or drainage from the incision.  The clinic staff is available to answer your questions during regular business hours.  Please don't hesitate to call and ask to speak to one of the nurses for clinical concerns.  If you have a medical emergency, go to the nearest emergency room or call 911.  A surgeon from [facility] is always on call at the hospital.  For further questions, please visit [email or web address]       Post Anesthesia Home Care Instructions  Activity: Get plenty of rest for the remainder of the day. A responsible  individual must stay with you for 24 hours following the procedure.  For the next 24 hours, DO NOT: -Drive a car -Paediatric nurse -Drink alcoholic beverages -Take any medication unless instructed by your physician -Make any legal decisions or sign important papers.  Meals: Start with liquid foods such as gelatin or soup. Progress to regular foods as tolerated. Avoid greasy, spicy, heavy foods. If  nausea and/or vomiting occur, drink only clear liquids until the nausea and/or vomiting subsides. Call your physician if vomiting continues.  Special Instructions/Symptoms: Your throat may feel dry or sore from the anesthesia or the breathing tube placed in your throat during surgery. If this causes discomfort, gargle with warm salt water. The discomfort should disappear within 24 hours.  If you had a scopolamine patch placed behind your ear for the management of post- operative nausea and/or vomiting:  1. The medication in the patch is effective for 72 hours, after which it should be removed.  Wrap patch in a tissue and discard in the trash. Wash hands thoroughly with soap and water. 2. You may remove the patch earlier than 72 hours if you experience unpleasant side effects which may include dry mouth, dizziness or visual disturbances. 3. Avoid touching the patch. Wash your hands with soap and water after contact with the patch.     Regional Anesthesia Blocks  1. Numbness or the inability to move the "blocked" extremity may last from 3-48 hours after placement. The length of time depends on the medication injected and your individual response to the medication. If the numbness is not going away after 48 hours, call your surgeon.  2. The extremity that is blocked will need to be protected until the numbness is gone and the  Strength has returned. Because you cannot feel it, you will need to take extra care to avoid injury. Because it may be weak, you may have difficulty moving it or using it. You may not know what position it is in without looking at it while the block is in effect.  3. For blocks in the legs and feet, returning to weight bearing and walking needs to be done carefully. You will need to wait until the numbness is entirely gone and the strength has returned. You should be able to move your leg and foot normally before you try and bear weight or walk. You will need someone to be  with you when you first try to ensure you do not fall and possibly risk injury.  4. Bruising and tenderness at the needle site are common side effects and will resolve in a few days.  5. Persistent numbness or new problems with movement should be communicated to the surgeon or the [place] [phone]).

## 2021-07-21 NOTE — Anesthesia Procedure Notes (Signed)
Procedure Name: LMA Insertion Date/Time: 07/21/2021 2:45 PM Performed by: Maryella Shivers, CRNA Pre-anesthesia Checklist: Patient identified, Emergency Drugs available, Suction available and Patient being monitored Patient Re-evaluated:Patient Re-evaluated prior to induction Oxygen Delivery Method: Circle system utilized Preoxygenation: Pre-oxygenation with 100% oxygen Induction Type: IV induction Ventilation: Mask ventilation without difficulty LMA: LMA inserted LMA Size: 4.0 Number of attempts: 1 Airway Equipment and Method: Bite block Placement Confirmation: positive ETCO2 Tube secured with: Tape Dental Injury: Teeth and Oropharynx as per pre-operative assessment

## 2021-07-21 NOTE — Transfer of Care (Signed)
Immediate Anesthesia Transfer of Care Note  Patient: Amanda Ayers  Procedure(s) Performed: LEFT BREAST LUMPECTOMY WITH RADIOACTIVE SEED AND SENTINEL LYMPH NODE BIOPSY (Left: Breast)  Patient Location: PACU  Anesthesia Type:GA combined with regional for post-op pain  Level of Consciousness: sedated  Airway & Oxygen Therapy: Patient Spontanous Breathing and Patient connected to face mask oxygen  Post-op Assessment: Report given to RN and Post -op Vital signs reviewed and stable  Post vital signs: Reviewed and stable  Last Vitals:  Vitals Value Taken Time  BP 120/77 07/21/21 1616  Temp    Pulse 79 07/21/21 1617  Resp 14 07/21/21 1617  SpO2 99 % 07/21/21 1617  Vitals shown include unvalidated device data.  Last Pain:  Vitals:   07/21/21 1154  TempSrc: Oral  PainSc: 0-No pain         Complications: No notable events documented.

## 2021-07-21 NOTE — Telephone Encounter (Signed)
Scheduled appointment per inbasket message. Patient aware.   ?

## 2021-07-21 NOTE — Anesthesia Procedure Notes (Signed)
Anesthesia Regional Block: Pectoralis block   Pre-Anesthetic Checklist: , timeout performed,  Correct Patient, Correct Site, Correct Laterality,  Correct Procedure, Correct Position, site marked,  Risks and benefits discussed,  Surgical consent,  Pre-op evaluation,  At surgeon's request and post-op pain management  Laterality: Left  Prep: chloraprep       Needles:   Needle Type: Stimiplex     Needle Length: 9cm      Additional Needles:   Procedures:,,,, ultrasound used (permanent image in chart),,    Narrative:  Start time: 07/21/2021 12:13 PM End time: 07/21/2021 12:33 PM Injection made incrementally with aspirations every 5 mL.  Performed by: Personally  Anesthesiologist: Nolon Nations, MD  Additional Notes: BP cuff, SpO2 and EKG monitors applied. Sedation begun.  Anesthetic injected incrementally, slowly, and after neg aspirations under direct ultrasound guidance. Good fascial spread noted. Patient tolerated well.

## 2021-07-21 NOTE — Anesthesia Preprocedure Evaluation (Addendum)
Anesthesia Evaluation  Patient identified by MRN, date of birth, ID band Patient awake    Reviewed: Allergy & Precautions, NPO status , Patient's Chart, lab work & pertinent test results  Airway Mallampati: I  TM Distance: >3 FB Neck ROM: Full    Dental  (+) Dental Advisory Given, Teeth Intact   Pulmonary neg pulmonary ROS,    Pulmonary exam normal breath sounds clear to auscultation       Cardiovascular negative cardio ROS Normal cardiovascular exam Rhythm:Regular Rate:Normal     Neuro/Psych negative neurological ROS     GI/Hepatic negative GI ROS, Neg liver ROS,   Endo/Other  negative endocrine ROS  Renal/GU negative Renal ROS     Musculoskeletal  (+) Arthritis ,   Abdominal (+) + obese,   Peds  Hematology negative hematology ROS (+)   Anesthesia Other Findings   Reproductive/Obstetrics                           Anesthesia Physical Anesthesia Plan  ASA: 2  Anesthesia Plan: General   Post-op Pain Management: Tylenol PO (pre-op)* and Gabapentin PO (pre-op)*   Induction: Intravenous  PONV Risk Score and Plan: 3 and Ondansetron, Treatment may vary due to age or medical condition, Midazolam, Dexamethasone and Scopolamine patch - Pre-op  Airway Management Planned: LMA  Additional Equipment: None  Intra-op Plan:   Post-operative Plan: Extubation in OR  Informed Consent: I have reviewed the patients History and Physical, chart, labs and discussed the procedure including the risks, benefits and alternatives for the proposed anesthesia with the patient or authorized representative who has indicated his/her understanding and acceptance.     Dental advisory given  Plan Discussed with: CRNA  Anesthesia Plan Comments:        Anesthesia Quick Evaluation

## 2021-07-21 NOTE — Progress Notes (Signed)
Assisted Dr. Germeroth with left, pectoralis, ultrasound guided block. Side rails up, monitors on throughout procedure. See vital signs in flow sheet. Tolerated Procedure well. 

## 2021-07-22 ENCOUNTER — Encounter (HOSPITAL_BASED_OUTPATIENT_CLINIC_OR_DEPARTMENT_OTHER): Payer: Self-pay | Admitting: General Surgery

## 2021-07-22 NOTE — Anesthesia Postprocedure Evaluation (Signed)
Anesthesia Post Note  Patient: Amanda Ayers  Procedure(s) Performed: LEFT BREAST LUMPECTOMY WITH RADIOACTIVE SEED AND SENTINEL LYMPH NODE BIOPSY (Left: Breast)     Patient location during evaluation: PACU Anesthesia Type: General Level of consciousness: sedated and patient cooperative Pain management: pain level controlled Vital Signs Assessment: post-procedure vital signs reviewed and stable Respiratory status: spontaneous breathing Cardiovascular status: stable Anesthetic complications: no   No notable events documented.  Last Vitals:  Vitals:   07/21/21 1640 07/21/21 1655  BP:  131/70  Pulse: 64 78  Resp: 12 13  Temp:  (!) 36.4 C  SpO2: 96% 96%    Last Pain:  Vitals:   07/22/21 1004  TempSrc:   PainSc: 0-No pain                 Nolon Nations

## 2021-07-24 LAB — SURGICAL PATHOLOGY

## 2021-07-27 ENCOUNTER — Encounter: Payer: Self-pay | Admitting: *Deleted

## 2021-07-27 ENCOUNTER — Telehealth: Payer: Self-pay | Admitting: *Deleted

## 2021-07-27 ENCOUNTER — Telehealth: Payer: Self-pay

## 2021-07-27 NOTE — Telephone Encounter (Signed)
Received order for oncotype testing. Requisition faxed to pathology and [facility] °

## 2021-07-27 NOTE — Telephone Encounter (Signed)
Notified Patient of completion of Disability Form. Form emailed to [email or web address] as requested. Copy of form mailed to patient as requested. No other needs or concerns voiced at this time.

## 2021-07-27 NOTE — Telephone Encounter (Signed)
Entered in Error

## 2021-07-28 ENCOUNTER — Telehealth: Payer: Self-pay | Admitting: *Deleted

## 2021-07-28 NOTE — Telephone Encounter (Signed)
Connected with [name] [phone] (home) per message "I need to speak with Amanda Ayers about my FMLA."  "[employer] manages our [employer].  Have you heard or received anything from [facility] for me?  [employer] says request was sent to office 07/20/2021 the day I requested FMLA and provided office phone and fax numbers.  Driving so unable to tell you the fax I used.  What is the fax number?  I'll call [employer] when I get home."  Provided Fax number [phone] for [employer] to send paperwork, instructions to obtain Request & Authorization For Use / Disclosure of [place] from medical records tab under patient and family services on [email or web address] site and, E-mail [email or web address] to return signed authorization.  Thanked this nurse for assisting.  Currently no further questions or needs.

## 2021-07-29 ENCOUNTER — Telehealth: Payer: Self-pay | Admitting: *Deleted

## 2021-07-29 ENCOUNTER — Encounter: Payer: Self-pay | Admitting: General Practice

## 2021-07-29 NOTE — Progress Notes (Signed)
[facility] Spiritual Care Note  Dawna Part by phone for [facility] follow-up call. She was in great spirits because she hasn't had to take any pain medication since her surgery. She is so pleased and grateful to be doing so well, and she plans to stop by [place] office later this morning.   [place], [place], [facility] Pager [phone] Voicemail [phone]

## 2021-07-29 NOTE — Telephone Encounter (Signed)
"  I am on my way there with the [facility] Authorization for Use/Disclosure form.  Have you received the [employer] information?  I also need the form completed and faxed to [facility] fax No.# [phone].  Form was not signed or dated by Dr. Chryl Heck, did not include surgery date of 07/21/2021 "  Connected with [name] [phone] (home) .  Asked if bottom of form was signed  "Have not received copy mailed to me but form was not signed where surgery date is." Advised connect with nurse who completed form.  Currently unable to transfer call.  This nurse will also notify RN.  No receipt of [employer] form.  Will notify upon receipt of [employer] form per patient request.

## 2021-08-03 ENCOUNTER — Ambulatory Visit: Payer: BC Managed Care – PPO | Admitting: Internal Medicine

## 2021-08-04 ENCOUNTER — Telehealth: Payer: Self-pay | Admitting: *Deleted

## 2021-08-04 NOTE — Telephone Encounter (Signed)
FMLA paperwork completed today placed in provider in-basket for collaborative pickup for provider review, signature and return to this nurse.

## 2021-08-05 ENCOUNTER — Inpatient Hospital Stay: Payer: BC Managed Care – PPO | Admitting: Hematology and Oncology

## 2021-08-09 NOTE — Therapy (Signed)
OUTPATIENT PHYSICAL THERAPY BREAST CANCER POST OP FOLLOW UP   Patient Name: BETTYMAE SCHLAFF MRN: [id number] DOB:1970/05/28, 51 y.o., female Today's Date: 08/10/2021   PT End of Session - 08/10/21 1355     Visit Number 2    Number of Visits 2    PT Start Time 1352    PT Stop Time 1435    PT Time Calculation (min) 43 min    Activity Tolerance Patient tolerated treatment well    Behavior During Therapy WFL for tasks assessed/performed             Past Medical History:  Diagnosis Date   Arthritis    R knee   Malignant neoplasm of upper-inner quadrant of left breast in female, estrogen receptor positive (HCC) 07/14/2021   Past Surgical History:  Procedure Laterality Date   BREAST LUMPECTOMY WITH RADIOACTIVE SEED AND SENTINEL LYMPH NODE BIOPSY Left 07/21/2021   Procedure: LEFT BREAST LUMPECTOMY WITH RADIOACTIVE SEED AND SENTINEL LYMPH NODE BIOPSY;  Surgeon: Almond Lint, MD;  Location: [facility];  Service: General;  Laterality: Left;   Patient Active Problem List   Diagnosis Date Noted   Malignant neoplasm of upper-inner quadrant of left breast in female, estrogen receptor positive (HCC) 07/14/2021   Obesity (BMI 35.0-39.9 without comorbidity) 06/05/2021   Routine general medical examination at a health care facility 06/05/2021    REFERRING PROVIDER: Dr. Almond Lint  REFERRING DIAG: Left breast cancer  THERAPY DIAG:  Malignant neoplasm of upper-inner quadrant of left breast in female, estrogen receptor positive (HCC)  Abnormal posture  Aftercare following surgery for neoplasm  Rationale for Evaluation and Treatment Rehabilitation  ONSET DATE: 07/21/2021  SUBJECTIVE:                                                                                                                                                                                           SUBJECTIVE STATEMENT: Patient reports she underwent a left lumpectomy and sentinel node biopsy (2  negative nodes) on 07/21/2021. She is awaiting Oncotype results. On Friday (08/07/21), she developed what appears to be a seroma. She is scheduled to see the PA at the surgeon's office tomorrow to possibly have it drained.  PERTINENT HISTORY:  Patient was diagnosed on 06/19/2021 with left grade 2 invasive ductal carcinoma breast cancer. She underwent a left lumpectomy and sentinel node biopsy (2 negative nodes) on 07/21/2021. It is ER/PR positive and HER2 negative with a Ki67 of 60%.   PATIENT GOALS:  Reassess how my recovery is going related to arm function, pain, and swelling.  PAIN:  Are you having pain? No  PRECAUTIONS: Recent  Surgery, left UE Lymphedema risk  ACTIVITY LEVEL / LEISURE: She is walking every day for 20 minutes and doing her HEP for her shoulder ROM.   OBJECTIVE:   PATIENT SURVEYS:  QUICK DASH:  Quick Dash - 08/10/21 0001     Open a tight or new jar No difficulty    Do heavy household chores (wash walls, wash floors) No difficulty    Carry a shopping bag or briefcase No difficulty    Wash your back No difficulty    Use a knife to cut food No difficulty    Recreational activities in which you take some force or impact through your arm, shoulder, or hand (golf, hammering, tennis) No difficulty    During the past week, to what extent has your arm, shoulder or hand problem interfered with your normal social activities with family, friends, neighbors, or groups? Not at all    During the past week, to what extent has your arm, shoulder or hand problem limited your work or other regular daily activities Not at all    Arm, shoulder, or hand pain. None    Tingling (pins and needles) in your arm, shoulder, or hand None    Difficulty Sleeping No difficulty    DASH Score 0 %              OBSERVATIONS:  Large axillary seroma present. Incisions appear to be healing well. No signs of infection.      POSTURE:  Forward head and rounded shoulders posture.  LYMPHEDEMA  ASSESSMENT:   UPPER EXTREMITY AROM/PROM:   A/PROM RIGHT   eval    Shoulder extension 51  Shoulder flexion 156  Shoulder abduction 174  Shoulder internal rotation 78  Shoulder external rotation 82                          (Blank rows = not tested)   A/PROM LEFT   eval LEFT 08/10/2021  Shoulder extension 41 37  Shoulder flexion 145 155  Shoulder abduction 176 171  Shoulder internal rotation 67 57  Shoulder external rotation 87 70                          (Blank rows = not tested)     CERVICAL AROM: All within normal limits   UPPER EXTREMITY STRENGTH: WNL     LYMPHEDEMA ASSESSMENTS:    LANDMARK RIGHT   eval RIGHT 08/10/2021  10 cm proximal to olecranon process 37.2 37.4  Olecranon process 28.1 29  10  cm proximal to ulnar styloid process 24 23.2  Just proximal to ulnar styloid process 17.5 16.6  Across hand at thumb web space 19.4 19.1  At base of 2nd digit 6.5 6  (Blank rows = not tested)   LANDMARK LEFT   eval LEFT 08/10/2021  10 cm proximal to olecranon process 36.5 36.214  Olecranon process 27.6 28.8  10 cm proximal to ulnar styloid process 22.2 22.7  Just proximal to ulnar styloid process 16.6 16.5  Across hand at thumb web space 19 18.9  At base of 2nd digit 6 6  (Blank rows = not tested)     Surgery type/Date: 07/21/2021 Left lumpectomy and sentinel node biopsy Number of lymph nodes removed: 2 Current/past treatment (chemo, radiation, hormone therapy): none Other symptoms:  Heaviness/tightness No Pain No Pitting edema No Infections No Decreased scar mobility Yes Stemmer sign No   PATIENT EDUCATION:  Education  details: HEP, lymphedema education Person educated: Patient Education method: Explanation and Demonstration Education comprehension: verbalized understanding and returned demonstration   HOME EXERCISE PROGRAM:  Reviewed previously given post op HEP.  ASSESSMENT:  CLINICAL IMPRESSION: Patient is doing well s/p left lumpectomy and  sentinel node biopsy on 07/21/2021 (2 negative nodes) except that she developed a large seroma in her left axilla 3 days ago. She is scheduled to see the PA at the surgeon's office tomorrow. Compression foam inside a stockinette was given to her to support that area between her bra and skin. She has regained full shoulder ROM and function and shows no signs of lymphedema. Incisions appear to be well healed with no redness or glue present. She has no need for PT at this time as her seroma will likely resolve with medical treatment from her surgeon.  Pt will benefit from skilled therapeutic intervention to improve on the following deficits: Decreased knowledge of precautions, impaired UE functional use, pain, decreased ROM, postural dysfunction.   PT treatment/interventions: ADL/Self care home management, Therapeutic exercises, Patient/Family education, and Re-evaluation     GOALS: Goals reviewed with patient? Yes  LONG TERM GOALS:  (STG=LTG)  GOALS Name Target Date  Goal status  1 Pt will demonstrate she has regained full shoulder ROM and function post operatively compared to baselines.  Baseline: 09/09/2021 MET     PLAN: PT FREQUENCY/DURATION: N/A  PLAN FOR NEXT SESSION: D/C   [facility]  [address], [address]  [place] [address]  [phone]  After Breast Cancer Class It is recommended you attend the ABC class to be educated on lymphedema risk reduction. This class is free of charge and lasts for 1 hour. It is a 1-time class. You will need to download the Webex app either on your phone or computer. We will send you a link the night before or the morning of the class. You should be able to click on that link to join the class. This is not a confidential class. You don't have to turn your camera on, but other participants may be able to see your email address. You are scheduled for July 3rd at 11:00.  Scar massage You can begin gentle scar massage to you  incision sites. Gently place one hand on the incision and move the skin (without sliding on the skin) in various directions. Do this for a few minutes and then you can gently massage either coconut oil or vitamin E cream into the scars.  Compression garment You should continue wearing your compression bra until you feel like you no longer have swelling.  Home exercise Program Continue doing the exercises you were given until you feel like you can do them without feeling any tightness at the end.   Walking Program Studies show that 30 minutes of walking per day (fast enough to elevate your heart rate) can significantly reduce the risk of a cancer recurrence. If you can't walk due to other medical reasons, we encourage you to find another activity you could do (like a stationary bike or water exercise).  Posture After breast cancer surgery, people frequently sit with rounded shoulders posture because it puts their incisions on slack and feels better. If you sit like this and scar tissue forms in that position, you can become very tight and have pain sitting or standing with good posture. Try to be aware of your posture and sit and stand up tall to heal properly.  Follow up PT: It is  recommended you return every 3 months for the first 3 years following surgery to be assessed on the SOZO machine for an L-Dex score. This helps prevent clinically significant lymphedema in 95% of patients. These follow up screens are 10 minute appointments that you are not billed for. You are scheduled for September 11th at 3:50.   PHYSICAL THERAPY DISCHARGE SUMMARY  Visits from Start of Care: 2  Current functional level related to goals / functional outcomes: Goals met. She has full ROM and function in left UE.   Remaining deficits: Large seroma left axilla   Education / Equipment: HEP and lymphedema education   Patient agrees to discharge. Patient goals were met. Patient is being discharged due to meeting  the stated rehab goals.  Bethann Punches, [place] 08/10/21 2:40 PM

## 2021-08-10 ENCOUNTER — Telehealth: Payer: Self-pay | Admitting: *Deleted

## 2021-08-10 ENCOUNTER — Other Ambulatory Visit: Payer: Self-pay

## 2021-08-10 ENCOUNTER — Encounter: Payer: Self-pay | Admitting: Physical Therapy

## 2021-08-10 ENCOUNTER — Ambulatory Visit: Payer: BC Managed Care – PPO | Attending: General Surgery | Admitting: Physical Therapy

## 2021-08-10 DIAGNOSIS — Z170 Estrogen receptor positive status [ER+]: Secondary | ICD-10-CM | POA: Insufficient documentation

## 2021-08-10 DIAGNOSIS — R293 Abnormal posture: Secondary | ICD-10-CM | POA: Insufficient documentation

## 2021-08-10 DIAGNOSIS — Z483 Aftercare following surgery for neoplasm: Secondary | ICD-10-CM | POA: Diagnosis present

## 2021-08-10 DIAGNOSIS — C50212 Malignant neoplasm of upper-inner quadrant of left female breast: Secondary | ICD-10-CM | POA: Insufficient documentation

## 2021-08-10 NOTE — Telephone Encounter (Signed)
Received oncotype results of 29/18%. Patient is scheduled to see Dr. Al Pimple 6/27 to discuss.

## 2021-08-11 ENCOUNTER — Inpatient Hospital Stay: Payer: BC Managed Care – PPO | Attending: Hematology and Oncology | Admitting: Hematology and Oncology

## 2021-08-11 ENCOUNTER — Encounter: Payer: Self-pay | Admitting: Hematology and Oncology

## 2021-08-11 DIAGNOSIS — Z170 Estrogen receptor positive status [ER+]: Secondary | ICD-10-CM | POA: Insufficient documentation

## 2021-08-11 DIAGNOSIS — C50212 Malignant neoplasm of upper-inner quadrant of left female breast: Secondary | ICD-10-CM | POA: Insufficient documentation

## 2021-08-11 NOTE — Assessment & Plan Note (Addendum)
This is a very pleasant 51 year old postmenopausal female patient with no significant past medical history with newly diagnosed left breast invasive ductal carcinoma, grade 2 out of 3, ER +90% strong staining PR 95% strong staining, Ki-67 of 60% and HER2 equivocal by IHC and FISH is negative. She is status post left breast lumpectomy with final pathology showing 8 mm grade 3 IDC, negative margins, no evidence of lymph node involvement.  Oncotype score resulted at 29, distant risk of recurrence at 9 years of 18% and absolute benefit of chemotherapy greater than 15%.  We have discussed about adjuvant docetaxel and cyclophosphamide every 21 days for 4 cycles.  Patient would like to think about it for a day, talk to her daughters and let us know by tomorrow.  We have discussed about risk of chemotherapy including but not limited to fatigue, nausea, vomiting, increased risk of infections, cytopenias, neuropathy.  We have discussed that some of the side effects can be permanent although in general the chemotherapy is very well-tolerated. She will want to proceed with port for chemotherapy administration. She wondered if I can give her a call tomorrow to discuss her decision.  We have also mentioned a cold cap today, she is not quite sure if she would want to try this. After chemotherapy, she will proceed with radiation and antiestrogen therapy

## 2021-08-12 ENCOUNTER — Encounter: Payer: Self-pay | Admitting: Hematology and Oncology

## 2021-08-12 ENCOUNTER — Inpatient Hospital Stay (HOSPITAL_BASED_OUTPATIENT_CLINIC_OR_DEPARTMENT_OTHER): Payer: BC Managed Care – PPO | Admitting: Hematology and Oncology

## 2021-08-12 ENCOUNTER — Telehealth: Payer: Self-pay | Admitting: Hematology and Oncology

## 2021-08-12 ENCOUNTER — Encounter: Payer: Self-pay | Admitting: *Deleted

## 2021-08-12 DIAGNOSIS — Z170 Estrogen receptor positive status [ER+]: Secondary | ICD-10-CM | POA: Diagnosis not present

## 2021-08-12 DIAGNOSIS — C50212 Malignant neoplasm of upper-inner quadrant of left female breast: Secondary | ICD-10-CM | POA: Diagnosis not present

## 2021-08-12 NOTE — Telephone Encounter (Signed)
This nurse received [place] paperwork today.  FMLA request successfully faxed to [phone] at this time.  Original copy mailed to patient address. [place] [id number] Process completed with copy of [employer] form to bin designated for items to be scanned.  No further instructions received, actions required or performed by this nurse.

## 2021-08-12 NOTE — Telephone Encounter (Signed)
Scheduled appointment per 06/27 los. Patient aware.

## 2021-08-12 NOTE — Progress Notes (Signed)
Amanda Ayers NOTE  Patient Care Team: Hoyt Koch, MD as PCP - General (Internal Medicine) Stark Klein, MD as Consulting Physician (General Surgery) Benay Pike, MD as Consulting Physician (Hematology and Oncology) Kyung Rudd, MD as Consulting Physician (Radiation Oncology) Mauro Kaufmann, RN as Oncology Nurse Navigator Rockwell Germany, RN as Oncology Nurse Navigator  CHIEF COMPLAINTS/PURPOSE OF CONSULTATION:  Newly diagnosed breast cancer  HISTORY OF PRESENTING ILLNESS:  Amanda Ayers 51 y.o. female is here because of recent diagnosis of left breast cancer  I reviewed her records extensively and collaborated the history with the patient.  SUMMARY OF ONCOLOGIC HISTORY: Oncology History  Malignant neoplasm of upper-inner quadrant of left breast in female, estrogen receptor positive ([place])  06/19/2021 Mammogram   Mammogram from Jun 19, 2021 showed possible asymmetry in the left breast.  Diagnostic mammogram showed suspicious mass in the 10:00 location of the left breast.  Ultrasound of the left axilla negative for lymphadenopathy   07/14/2021 Initial Diagnosis   Malignant neoplasm of upper-inner quadrant of left breast in female, estrogen receptor positive [facility])    Pathology Results   Pathology from the left breast showed invasive ductal carcinoma grade 2 out of 3.  Prognostic showed ER 90% positive strong staining, PR 95% strong staining, Ki-67 of 60% and HER2 equivocal by IHC and FISH is pending   07/21/2021 Definitive Surgery   Left breast lumpectomy showed invasive ductal carcinoma measuring 8 mm, grade 3, focal high-grade DCIS, negative resection margins negative for lymphovascular or perineural invasion, all lymph nodes with no evidence of involvement.    Oncotype testing   Oncotype testing resulted at score of 29, distant risk of recurrence at 9 years of approximately 18%, absolute benefit of chemotherapy greater than 15%   08/24/2021  -  Chemotherapy   Patient is on Treatment Plan : BREAST TC q21d      Interval History  [name] is here for an initial visit with her 2 daughters.   She is here for follow up after surgery. When I met her yesterday, she was a bit unsure of chemotherapy wanted to talk to her and her daughters and let us know about her decision today.  She is willing to proceed with chemotherapy as recommended.  She is willing to start with peripheral IV and will consider port later if is hard for her to tolerate multiple needle sticks.   MEDICAL HISTORY:  Past Medical History:  Diagnosis Date   Arthritis    R knee   Malignant neoplasm of upper-inner quadrant of left breast in female, estrogen receptor positive ([place]) 07/14/2021    SURGICAL HISTORY: Past Surgical History:  Procedure Laterality Date   BREAST LUMPECTOMY WITH RADIOACTIVE SEED AND SENTINEL LYMPH NODE BIOPSY Left 07/21/2021   Procedure: LEFT BREAST LUMPECTOMY WITH RADIOACTIVE SEED AND SENTINEL LYMPH NODE BIOPSY;  Surgeon: Stark Klein, MD;  Location: [facility];  Service: General;  Laterality: Left;    SOCIAL HISTORY: Social History   Socioeconomic History   Marital status: Single    Spouse name: Not on file   Number of children: Not on file   Years of education: Not on file   Highest education level: Not on file  Occupational History   Not on file  Tobacco Use   Smoking status: Never    Passive exposure: Never   Smokeless tobacco: Never  Substance and Sexual Activity   Alcohol use: Not Currently    Alcohol/week: 1.0  standard drink of alcohol    Types: 1 Cans of beer per week   Drug use: Never   Sexual activity: Yes    Birth control/protection: None  Other Topics Concern   Not on file  Social History Narrative   Not on file   Social Determinants of Health   Financial Resource Strain: Not on file  Food Insecurity: Not on file  Transportation Needs: Not on file  Physical Activity: Not on file  Stress:  Not on file  Social Connections: Not on file  Intimate Partner Violence: Not on file    FAMILY HISTORY: Family History  Problem Relation Age of Onset   Multiple myeloma Maternal Uncle        d. 63s   Uterine cancer Maternal Grandmother        dx after age 17    ALLERGIES:  is allergic to prednisone.  MEDICATIONS:  Current Outpatient Medications  Medication Sig Dispense Refill   HYDROcodone-acetaminophen (NORCO/VICODIN) 5-325 MG tablet Take 1 tablet by mouth every 6 (six) hours as needed for moderate pain. (Patient not taking: Reported on 08/10/2021) 15 tablet 0   Ibuprofen-diphenhydrAMINE Cit (ADVIL PM PO) Take by mouth at bedtime as needed.     No current facility-administered medications for this visit.     PHYSICAL EXAMINATION: ECOG PERFORMANCE STATUS: 0 - Asymptomatic Vital signs and physical examination not done, telephone visit LABORATORY DATA:  I have reviewed the data as listed Lab Results  Component Value Date   WBC 5.8 07/15/2021   HGB 12.0 07/15/2021   HCT 36.6 07/15/2021   MCV 97.3 07/15/2021   PLT 279 07/15/2021   Lab Results  Component Value Date   NA 137 07/15/2021   K 4.0 07/15/2021   CL 105 07/15/2021   CO2 28 07/15/2021    RADIOGRAPHIC STUDIES: I have personally reviewed the radiological reports and agreed with the findings in the report.  ASSESSMENT AND PLAN:  Malignant neoplasm of upper-inner quadrant of left breast in female, estrogen receptor positive ([place]) This is a very pleasant 51 year old postmenopausal female patient with no significant past medical history with newly diagnosed left breast invasive ductal carcinoma, grade 2 out of 3, ER +90% strong staining PR 95% strong staining, Ki-67 of 60% and HER2 equivocal by IHC and FISH is negative. She is status post left breast lumpectomy with final pathology showing 8 mm grade 3 IDC, negative margins, no evidence of lymph node involvement.  Oncotype score resulted at 29, distant risk of  recurrence at 9 years of 18% and absolute benefit of chemotherapy greater than 15%.  We have discussed about adjuvant docetaxel and cyclophosphamide every 21 days for 4 cycles.  Patient would like to think about it for a day, talk to her daughters and let us know by tomorrow.  We have discussed about risk of chemotherapy including but not limited to fatigue, nausea, vomiting, increased risk of infections, cytopenias, neuropathy.  We have discussed that some of the side effects can be permanent although in general the chemotherapy is very well-tolerated. She will want to proceed with port for chemotherapy administration. I called her to review decision, she wanted to talk to her daughters about it. She is agreeable to proceed with adjuvant TC as recommended.  She is okay to proceed with a peripheral IV at this time.  She will consider a port if it becomes hard for her to tolerate multiple needle sticks. Chemotherapy plan placed per discussion.  She was not sure about digni  cap during our discussion.  I will send an in basket message to schedule chemo teach.  Okay to consider adjuvant chemotherapy in the next 1 to 2 weeks. Thank you for consulting [place] the care of this patient.  Please not hesitate to contact us with any additional questions or concerns     I connected with  Amanda Ayers on 08/12/21 by telephone and verified that I am speaking with the correct person using two identifiers.   I discussed the limitations of evaluation and management by telemedicine. The patient expressed understanding and agreed to proceed.   Total time spent: 15 minutes including history, discussion over the phone, placement of treatment plan and coordination of care All questions were answered. The patient knows to call the clinic with any problems, questions or concerns.    Benay Pike, MD 08/12/21

## 2021-08-12 NOTE — Progress Notes (Signed)
START ON PATHWAY REGIMEN - Breast     A cycle is every 21 days:     Docetaxel      Cyclophosphamide   **Always confirm dose/schedule in your pharmacy ordering system**  Patient Characteristics: Postoperative without Neoadjuvant Therapy (Pathologic Staging), Invasive Disease, Adjuvant Therapy, HER2 Negative/Unknown/Equivocal, ER Positive, Node Negative, pT1a, pN1mi or pT1b-c, pN0/N1mi or pT2 or Higher, pN0, Oncotype High Risk (? 26) Therapeutic Status: Postoperative without Neoadjuvant Therapy (Pathologic Staging) AJCC Grade: G3 AJCC N Category: pN0 AJCC M Category: cM0 ER Status: Positive (+) AJCC 8 Stage Grouping: IA HER2 Status: Negative (-) Oncotype Dx Recurrence Score: 29 AJCC T Category: pT1b PR Status: Positive (+) Adjuvant Therapy Status: No Adjuvant Therapy Received Yet or Changing Initial Adjuvant Regimen due to Tolerance Has this patient completed genomic testing<= Yes - Oncotype DX(R) Intent of Therapy: Curative Intent, Discussed with Patient 

## 2021-08-12 NOTE — Assessment & Plan Note (Addendum)
This is a very pleasant 51 year old postmenopausal female patient with no significant past medical history with newly diagnosed left breast invasive ductal carcinoma, grade 2 out of 3, ER +90% strong staining PR 95% strong staining, Ki-67 of 60% and HER2 equivocal by IHC and FISH is negative. She is status post left breast lumpectomy with final pathology showing 8 mm grade 3 IDC, negative margins, no evidence of lymph node involvement.  Oncotype score resulted at 29, distant risk of recurrence at 9 years of 18% and absolute benefit of chemotherapy greater than 15%.  We have discussed about adjuvant docetaxel and cyclophosphamide every 21 days for 4 cycles.  Patient would like to think about it for a day, talk to her daughters and let us know by tomorrow.  We have discussed about risk of chemotherapy including but not limited to fatigue, nausea, vomiting, increased risk of infections, cytopenias, neuropathy.  We have discussed that some of the side effects can be permanent although in general the chemotherapy is very well-tolerated. She will want to proceed with port for chemotherapy administration. I called her to review decision, she wanted to talk to her daughters about it. She is agreeable to proceed with adjuvant TC as recommended.  She is okay to proceed with a peripheral IV at this time.  She will consider a port if it becomes hard for her to tolerate multiple needle sticks. Chemotherapy plan placed per discussion.  She was not sure about digni cap during our discussion.  I will send an in basket message to schedule chemo teach.  Okay to consider adjuvant chemotherapy in the next 1 to 2 weeks. Thank you for consulting [place] the care of this patient.  Please not hesitate to contact us with any additional questions or concerns

## 2021-08-14 ENCOUNTER — Encounter: Payer: Self-pay | Admitting: *Deleted

## 2021-08-14 ENCOUNTER — Encounter (HOSPITAL_COMMUNITY): Payer: Self-pay

## 2021-08-17 ENCOUNTER — Other Ambulatory Visit: Payer: Self-pay | Admitting: Hematology and Oncology

## 2021-08-17 ENCOUNTER — Telehealth: Payer: Self-pay | Admitting: *Deleted

## 2021-08-17 DIAGNOSIS — Z170 Estrogen receptor positive status [ER+]: Secondary | ICD-10-CM

## 2021-08-17 MED ORDER — LIDOCAINE-PRILOCAINE 2.5-2.5 % EX CREA: TOPICAL_CREAM | CUTANEOUS | 3 refills | Status: DC

## 2021-08-17 MED ORDER — DEXAMETHASONE 4 MG PO TABS: 8.0000 mg | ORAL_TABLET | Freq: Two times a day (BID) | ORAL | 1 refills | Status: DC

## 2021-08-17 MED ORDER — PROCHLORPERAZINE MALEATE 10 MG PO TABS: 10.0000 mg | ORAL_TABLET | Freq: Four times a day (QID) | ORAL | 1 refills | Status: DC | PRN

## 2021-08-17 MED ORDER — ONDANSETRON HCL 8 MG PO TABS: 8.0000 mg | ORAL_TABLET | Freq: Two times a day (BID) | ORAL | 1 refills | Status: DC | PRN

## 2021-08-17 NOTE — Progress Notes (Signed)
Premeds sent

## 2021-08-17 NOTE — Telephone Encounter (Signed)
[facility] 16070 Nausea Study:  Informed patient she is eligible for this study and briefly explained the nature of the study and that participation is voluntary. Asked patient if research nurse can meet with her after her Chemotherapy Education class Wednesday to provide her with more information.  Patient agreed. Thanked patient for her time and plan to meet with her Wednesday after chemotherapy education class.  Foye Spurling, BSN, RN, [place] Nurse II 08/17/2021 2:01 PM

## 2021-08-19 ENCOUNTER — Other Ambulatory Visit: Payer: Self-pay

## 2021-08-19 ENCOUNTER — Inpatient Hospital Stay: Payer: BC Managed Care – PPO | Attending: Hematology and Oncology

## 2021-08-19 DIAGNOSIS — C50212 Malignant neoplasm of upper-inner quadrant of left female breast: Secondary | ICD-10-CM | POA: Insufficient documentation

## 2021-08-19 DIAGNOSIS — Z170 Estrogen receptor positive status [ER+]: Secondary | ICD-10-CM | POA: Insufficient documentation

## 2021-08-19 DIAGNOSIS — Z5111 Encounter for antineoplastic chemotherapy: Secondary | ICD-10-CM | POA: Insufficient documentation

## 2021-08-19 DIAGNOSIS — Z5189 Encounter for other specified aftercare: Secondary | ICD-10-CM | POA: Insufficient documentation

## 2021-08-20 ENCOUNTER — Encounter (HOSPITAL_COMMUNITY): Payer: Self-pay

## 2021-08-20 ENCOUNTER — Encounter: Payer: Self-pay | Admitting: Hematology and Oncology

## 2021-08-20 NOTE — Progress Notes (Signed)
Patient called after receiving my card per my request.  Introduced myself as Arboriculturist and to offer available resources. Discussed one-time $1000 Radio broadcast assistant to assist with personal expenses while going through treatment. She will provide documentation on 7/11 at check in and receive grant paperwork and expense sheet. She will call me after appointment at her earliest convenience to discuss grant details.  Asked about insurance ded/OOP for possible copay assistance. She states she has met ded but not OOP and gave me permission to apply on her behalf if any available.  She has my card for any additional financial questions or concerns.

## 2021-08-20 NOTE — Progress Notes (Signed)
Pharmacist Chemotherapy Monitoring - Initial Assessment    Anticipated start date: 08/25/21   The following has been reviewed per standard work regarding the patient's treatment regimen: The patient's diagnosis, treatment plan and drug doses, and organ/hematologic function Lab orders and baseline tests specific to treatment regimen  The treatment plan start date, drug sequencing, and pre-medications Prior authorization status  Patient's documented medication list, including drug-drug interaction screen and prescriptions for anti-emetics and supportive care specific to the treatment regimen The drug concentrations, fluid compatibility, administration routes, and timing of the medications to be used The patient's access for treatment and lifetime cumulative dose history, if applicable  The patient's medication allergies and previous infusion related reactions, if applicable   Changes made to treatment plan:  treatment plan date  Follow up needed:  N/A   Kennith Center, Pharm.D., CPP 08/20/2021'@9'$ :32 AM

## 2021-08-24 MED FILL — Dexamethasone Sodium Phosphate Inj 100 MG/10ML: INTRAMUSCULAR | Qty: 1 | Status: AC

## 2021-08-25 ENCOUNTER — Other Ambulatory Visit: Payer: Self-pay

## 2021-08-25 ENCOUNTER — Inpatient Hospital Stay: Payer: BC Managed Care – PPO

## 2021-08-25 ENCOUNTER — Encounter: Payer: Self-pay | Admitting: *Deleted

## 2021-08-25 ENCOUNTER — Encounter: Payer: Self-pay | Admitting: Hematology and Oncology

## 2021-08-25 ENCOUNTER — Inpatient Hospital Stay (HOSPITAL_BASED_OUTPATIENT_CLINIC_OR_DEPARTMENT_OTHER): Payer: BC Managed Care – PPO | Admitting: Hematology and Oncology

## 2021-08-25 VITALS — BP 115/64 | HR 65 | Temp 98.2°F | Resp 17

## 2021-08-25 DIAGNOSIS — Z5111 Encounter for antineoplastic chemotherapy: Secondary | ICD-10-CM | POA: Diagnosis not present

## 2021-08-25 DIAGNOSIS — C50212 Malignant neoplasm of upper-inner quadrant of left female breast: Secondary | ICD-10-CM | POA: Diagnosis not present

## 2021-08-25 DIAGNOSIS — Z170 Estrogen receptor positive status [ER+]: Secondary | ICD-10-CM | POA: Diagnosis not present

## 2021-08-25 DIAGNOSIS — Z5189 Encounter for other specified aftercare: Secondary | ICD-10-CM | POA: Diagnosis not present

## 2021-08-25 LAB — CMP (CANCER CENTER ONLY)
ALT: 10 U/L (ref 0–44)
AST: 12 U/L — ABNORMAL LOW (ref 15–41)
Albumin: 4.3 g/dL (ref 3.5–5.0)
Alkaline Phosphatase: 41 U/L (ref 38–126)
Anion gap: 7 (ref 5–15)
BUN: 24 mg/dL — ABNORMAL HIGH (ref 6–20)
CO2: 24 mmol/L (ref 22–32)
Calcium: 9.9 mg/dL (ref 8.9–10.3)
Chloride: 108 mmol/L (ref 98–111)
Creatinine: 1 mg/dL (ref 0.44–1.00)
GFR, Estimated: >60 mL/min (ref >60)
Glucose, Bld: 116 mg/dL — ABNORMAL HIGH (ref 70–99)
Potassium: 3.6 mmol/L (ref 3.5–5.1)
Sodium: 139 mmol/L (ref 135–145)
Total Bilirubin: 0.3 mg/dL (ref 0.3–1.2)
Total Protein: 7.4 g/dL (ref 6.5–8.1)

## 2021-08-25 LAB — CBC WITH DIFFERENTIAL (CANCER CENTER ONLY)
Abs Immature Granulocytes: 0.07 10*3/uL (ref 0.00–0.07)
Basophils Absolute: 0 10*3/uL (ref 0.0–0.1)
Basophils Relative: 0 %
Eosinophils Absolute: 0 10*3/uL (ref 0.0–0.5)
Eosinophils Relative: 0 %
HCT: 34.7 % — ABNORMAL LOW (ref 36.0–46.0)
Hemoglobin: 11.4 g/dL — ABNORMAL LOW (ref 12.0–15.0)
Immature Granulocytes: 1 %
Lymphocytes Relative: 12 %
Lymphs Abs: 1.3 10*3/uL (ref 0.7–4.0)
MCH: 31.4 pg (ref 26.0–34.0)
MCHC: 32.9 g/dL (ref 30.0–36.0)
MCV: 95.6 fL (ref 80.0–100.0)
Monocytes Absolute: 0.6 10*3/uL (ref 0.1–1.0)
Monocytes Relative: 6 %
Neutro Abs: 8.9 10*3/uL — ABNORMAL HIGH (ref 1.7–7.7)
Neutrophils Relative %: 81 %
Platelet Count: 348 10*3/uL (ref 150–400)
RBC: 3.63 MIL/uL — ABNORMAL LOW (ref 3.87–5.11)
RDW: 11.5 % (ref 11.5–15.5)
WBC Count: 11 10*3/uL — ABNORMAL HIGH (ref 4.0–10.5)
nRBC: 0 % (ref 0.0–0.2)

## 2021-08-25 MED ORDER — SODIUM CHLORIDE 0.9 % IV SOLN
10.0000 mg | Freq: Once | INTRAVENOUS | Status: AC
  Administered 2021-08-25: 10 mg via INTRAVENOUS
  Filled 2021-08-25: qty 10

## 2021-08-25 MED ORDER — SODIUM CHLORIDE 0.9 % IV SOLN: Freq: Once | INTRAVENOUS | Status: AC

## 2021-08-25 MED ORDER — SODIUM CHLORIDE 0.9 % IV SOLN
75.0000 mg/m2 | Freq: Once | INTRAVENOUS | Status: AC
  Administered 2021-08-25: 170 mg via INTRAVENOUS
  Filled 2021-08-25: qty 17

## 2021-08-25 MED ORDER — SODIUM CHLORIDE 0.9 % IV SOLN
600.0000 mg/m2 | Freq: Once | INTRAVENOUS | Status: AC
  Administered 2021-08-25: 1380 mg via INTRAVENOUS
  Filled 2021-08-25: qty 69

## 2021-08-25 MED ORDER — PALONOSETRON HCL INJECTION 0.25 MG/5ML
0.2500 mg | Freq: Once | INTRAVENOUS | Status: AC
  Administered 2021-08-25: 0.25 mg via INTRAVENOUS
  Filled 2021-08-25: qty 5

## 2021-08-25 NOTE — Assessment & Plan Note (Addendum)
This is a very pleasant 51 year old postmenopausal female patient with no significant past medical history with newly diagnosed left breast invasive ductal carcinoma, grade 2 out of 3, ER +90% strong staining PR 95% strong staining, Ki-67 of 60% and HER2 equivocal by IHC and FISH is negative. She is status post left breast lumpectomy with final pathology showing 8 mm grade 3 IDC, negative margins, no evidence of lymph node involvement.    Oncotype score resulted at 29, distant risk of recurrence at 9 years of 18% and absolute benefit of chemotherapy greater than 15%.  We have discussed about adjuvant docetaxel and cyclophosphamide every 21 days for 4 cycles.  She agreed to adjuvant chemotherapy and is here before planned cycle 1 of docetaxel and cyclophosphamide with growth factor support.  She declined Dignicap. She wants to try cooling and compression for reducing the risk of peripheral neuropathy. No concerning review of systems or physical examination findings today.  Labs pending at the time of my visit.  Okay to proceed with chemotherapy as scheduled if labs are within parameters.  We have once again discussed about the possibility of her diffuse arthralgias with growth factor and to consider Claritin and as needed Tylenol for management of pain.  She was strongly encouraged to contact us with any intolerable symptoms.  Her follow-up appointments and infusions are currently scheduled.

## 2021-08-25 NOTE — Progress Notes (Signed)
[facility] NOTE  Patient Care Team: Hoyt Koch, MD as PCP - General (Internal Medicine) Stark Klein, MD as Consulting Physician (General Surgery) Benay Pike, MD as Consulting Physician (Hematology and Oncology) Kyung Rudd, MD as Consulting Physician (Radiation Oncology) Mauro Kaufmann, RN as Oncology Nurse Navigator Rockwell Germany, RN as Oncology Nurse Navigator  CHIEF COMPLAINTS/PURPOSE OF CONSULTATION:  Newly diagnosed breast cancer  HISTORY OF PRESENTING ILLNESS:  Amanda Ayers 51 y.o. female is here because of recent diagnosis of left breast cancer  I reviewed her records extensively and collaborated the history with the patient.  SUMMARY OF ONCOLOGIC HISTORY: Oncology History  Malignant neoplasm of upper-inner quadrant of left breast in female, estrogen receptor positive ([place])  06/19/2021 Mammogram   Mammogram from Jun 19, 2021 showed possible asymmetry in the left breast.  Diagnostic mammogram showed suspicious mass in the 10:00 location of the left breast.  Ultrasound of the left axilla negative for lymphadenopathy   07/14/2021 Initial Diagnosis   Malignant neoplasm of upper-inner quadrant of left breast in female, estrogen receptor positive [facility])    Pathology Results   Pathology from the left breast showed invasive ductal carcinoma grade 2 out of 3.  Prognostic showed ER 90% positive strong staining, PR 95% strong staining, Ki-67 of 60% and HER2 equivocal by IHC and FISH is pending   07/21/2021 Definitive Surgery   Left breast lumpectomy showed invasive ductal carcinoma measuring 8 mm, grade 3, focal high-grade DCIS, negative resection margins negative for lymphovascular or perineural invasion, all lymph nodes with no evidence of involvement.    Oncotype testing   Oncotype testing resulted at score of 29, distant risk of recurrence at 9 years of approximately 18%, absolute benefit of chemotherapy greater than 15%   07/21/2021  Oncotype testing   Oncotype score of 29, distant recurrence risk at 9 years was thought to be 18% with antiestrogen therapy alone.  There is demonstrated group average absolute chemotherapy benefit in this arm of greater than 15%.   08/25/2021 -  Chemotherapy   Patient is on Treatment Plan : BREAST TC q21d      Interval History  [name] is here for follow-up before planned cycle 1 of docetaxel and cyclophosphamide. Since last visit, she is doing quite well.  Her postop seroma in the left axilla has significantly improved.  She appears to have some lymphedema in the left arm but this is of no immediate concern. She is understandably anxious about having to go through chemotherapy but is willing to proceed with it. Rest of the pertinent 10 point ROS reviewed and negative  MEDICAL HISTORY:  Past Medical History:  Diagnosis Date   Arthritis    R knee   Malignant neoplasm of upper-inner quadrant of left breast in female, estrogen receptor positive ([place]) 07/14/2021    SURGICAL HISTORY: Past Surgical History:  Procedure Laterality Date   BREAST LUMPECTOMY WITH RADIOACTIVE SEED AND SENTINEL LYMPH NODE BIOPSY Left 07/21/2021   Procedure: LEFT BREAST LUMPECTOMY WITH RADIOACTIVE SEED AND SENTINEL LYMPH NODE BIOPSY;  Surgeon: Stark Klein, MD;  Location: [facility];  Service: General;  Laterality: Left;    SOCIAL HISTORY: Social History   Socioeconomic History   Marital status: Single    Spouse name: Not on file   Number of children: Not on file   Years of education: Not on file   Highest education level: Not on file  Occupational History   Not on file  Tobacco Use   Smoking status: Never    Passive exposure: Never   Smokeless tobacco: Never  Substance and Sexual Activity   Alcohol use: Not Currently    Alcohol/week: 1.0 standard drink of alcohol    Types: 1 Cans of beer per week   Drug use: Never   Sexual activity: Yes    Birth control/protection: None  Other  Topics Concern   Not on file  Social History Narrative   Not on file   Social Determinants of Health   Financial Resource Strain: Not on file  Food Insecurity: Not on file  Transportation Needs: Not on file  Physical Activity: Not on file  Stress: Not on file  Social Connections: Not on file  Intimate Partner Violence: Not on file    FAMILY HISTORY: Family History  Problem Relation Age of Onset   Multiple myeloma Maternal Uncle        d. 8s   Uterine cancer Maternal Grandmother        dx after age 18    ALLERGIES:  is allergic to prednisone.  MEDICATIONS:  Current Outpatient Medications  Medication Sig Dispense Refill   dexamethasone (DECADRON) 4 MG tablet Take 2 tablets (8 mg total) by mouth 2 (two) times daily. Start the day before Taxotere. Then again the day after chemo for 3 days. 30 tablet 1   HYDROcodone-acetaminophen (NORCO/VICODIN) 5-325 MG tablet Take 1 tablet by mouth every 6 (six) hours as needed for moderate pain. (Patient not taking: Reported on 08/10/2021) 15 tablet 0   Ibuprofen-diphenhydrAMINE Cit (ADVIL PM PO) Take by mouth at bedtime as needed.     lidocaine-prilocaine (EMLA) cream Apply to affected area once 30 g 3   ondansetron (ZOFRAN) 8 MG tablet Take 1 tablet (8 mg total) by mouth 2 (two) times daily as needed for refractory nausea / vomiting. Start on day 3 after chemo. 30 tablet 1   prochlorperazine (COMPAZINE) 10 MG tablet Take 1 tablet (10 mg total) by mouth every 6 (six) hours as needed (Nausea or vomiting). 30 tablet 1   No current facility-administered medications for this visit.     PHYSICAL EXAMINATION: ECOG PERFORMANCE STATUS: 0 - Asymptomatic  Physical Exam Constitutional:      Appearance: Normal appearance.  Cardiovascular:     Rate and Rhythm: Normal rate and regular rhythm.  Pulmonary:     Effort: Pulmonary effort is normal.     Breath sounds: Normal breath sounds.  Musculoskeletal:        General: No swelling.     Cervical  back: Normal range of motion and neck supple. No rigidity.  Lymphadenopathy:     Cervical: No cervical adenopathy.  Neurological:     General: No focal deficit present.     Mental Status: She is alert.    LABORATORY DATA:  I have reviewed the data as listed Lab Results  Component Value Date   WBC 5.8 07/15/2021   HGB 12.0 07/15/2021   HCT 36.6 07/15/2021   MCV 97.3 07/15/2021   PLT 279 07/15/2021   Lab Results  Component Value Date   NA 137 07/15/2021   K 4.0 07/15/2021   CL 105 07/15/2021   CO2 28 07/15/2021    RADIOGRAPHIC STUDIES: I have personally reviewed the radiological reports and agreed with the findings in the report.  ASSESSMENT AND PLAN:  Malignant neoplasm of upper-inner quadrant of left breast in female, estrogen receptor positive ([place]) This is a very pleasant 51 year old postmenopausal female patient  with no significant past medical history with newly diagnosed left breast invasive ductal carcinoma, grade 2 out of 3, ER +90% strong staining PR 95% strong staining, Ki-67 of 60% and HER2 equivocal by IHC and FISH is negative. She is status post left breast lumpectomy with final pathology showing 8 mm grade 3 IDC, negative margins, no evidence of lymph node involvement.    Oncotype score resulted at 29, distant risk of recurrence at 9 years of 18% and absolute benefit of chemotherapy greater than 15%.  We have discussed about adjuvant docetaxel and cyclophosphamide every 21 days for 4 cycles.  She agreed to adjuvant chemotherapy and is here before planned cycle 1 of docetaxel and cyclophosphamide with growth factor support.  She declined Dignicap. She wants to try cooling and compression for reducing the risk of peripheral neuropathy. No concerning review of systems or physical examination findings today.  Labs pending at the time of my visit.  Okay to proceed with chemotherapy as scheduled if labs are within parameters.  We have once again discussed about the  possibility of her diffuse arthralgias with growth factor and to consider Claritin and as needed Tylenol for management of pain.  She was strongly encouraged to contact us with any intolerable symptoms.  Her follow-up appointments and infusions are currently scheduled.  Total time spent: 30 minutes including history, physical exam, review of records, counseling and coordination of care All questions were answered. The patient knows to call the clinic with any problems, questions or concerns.    Benay Pike, MD 08/25/21

## 2021-08-25 NOTE — Research (Signed)
Trial:  DCP-001: Use of a Clinical Trial Screening Tool to Address Cancer Health Disparities in the [facility] [facility] Welcome)   Patient Amanda Ayers declined participation in the [facility] 71219 study and is a potential candidate for the above listed study.  This Clinical Research Nurse met with [name], [id number], on 08/25/21 in a manner and location that ensures patient privacy to discuss participation in the above listed research study.  Patient is Accompanied by her daughter .  A copy of the informed consent document and separate HIPAA Authorization was provided to the patient.  Patient reads, speaks, and understands [place].   Patient was provided with the business card of this Nurse previously and states she still has it. She was encouraged to contact the research team with any questions.  Approximately 5 minutes were spent with the patient reviewing the informed consent documents.  Patient was provided the option of taking informed consent documents home to review and was encouraged to review at their convenience with their support network, including other care providers. Patient took the consent documents home to review. Research will follow up with patient on next infusion appointment and she was encouraged to call if she has any questions before that time.   Foye Spurling, BSN, RN, [employer] Research Nurse II 08/25/2021 3:00 PM

## 2021-08-25 NOTE — Progress Notes (Signed)
Patient presented documentation for [employer] grant at registration.  Patient is approved for one-time $1000 [employer] grant to assist with personal expenses while going through treatment. She has a copy of approval letter and expense sheet along with Outpatient Pharmacy information. She knows to call me at her earliest convenience to discuss grant details. She received a gift card today from her grant per her request.  She has my card for any additional financial questions or concerns.

## 2021-08-25 NOTE — Patient Instructions (Addendum)
[facility]  Discharge Instructions: Thank you for choosing [facility] to provide your oncology and hematology care.   If you have a lab appointment with the [facility], please go directly to the [facility] and check in at the registration area.   Wear comfortable clothing and clothing appropriate for easy access to any Portacath or PICC line.   We strive to give you quality time with your provider. You may need to reschedule your appointment if you arrive late (15 or more minutes).  Arriving late affects you and other patients whose appointments are after yours.  Also, if you miss three or more appointments without notifying the office, you may be dismissed from the clinic at the provider's discretion.      For prescription refill requests, have your pharmacy contact our office and allow 72 hours for refills to be completed.    Today you received the following chemotherapy and/or immunotherapy agents: Docetaxel and Cyclophosphamide.    To help prevent nausea and vomiting after your treatment, we encourage you to take your nausea medication as directed.  BELOW ARE SYMPTOMS THAT SHOULD BE REPORTED IMMEDIATELY: *FEVER GREATER THAN 100.4 F (38 C) OR HIGHER *CHILLS OR SWEATING *NAUSEA AND VOMITING THAT IS NOT CONTROLLED WITH YOUR NAUSEA MEDICATION *UNUSUAL SHORTNESS OF BREATH *UNUSUAL BRUISING OR BLEEDING *URINARY PROBLEMS (pain or burning when urinating, or frequent urination) *BOWEL PROBLEMS (unusual diarrhea, constipation, pain near the anus) TENDERNESS IN MOUTH AND THROAT WITH OR WITHOUT PRESENCE OF ULCERS (sore throat, sores in mouth, or a toothache) UNUSUAL RASH, SWELLING OR PAIN  UNUSUAL VAGINAL DISCHARGE OR ITCHING   Items with * indicate a potential emergency and should be followed up as soon as possible or go to the Emergency Department if any problems should occur.  Please show the CHEMOTHERAPY ALERT CARD or IMMUNOTHERAPY ALERT  CARD at check-in to the Emergency Department and triage nurse.  Should you have questions after your visit or need to cancel or reschedule your appointment, please contact [facility]  Dept: [phone]  and follow the prompts.  Office hours are 8:00 a.m. to 4:30 p.m. Monday - Friday. Please note that voicemails left after 4:00 p.m. may not be returned until the following business day.  We are closed weekends and major holidays. You have access to a nurse at all times for urgent questions. Please call the main number to the clinic Dept: [phone] and follow the prompts.   For any non-urgent questions, you may also contact your provider using MyChart. We now offer e-Visits for anyone 49 and older to request care online for non-urgent symptoms. For details visit [email or web address].   Also download the MyChart app! Go to the app store, search "MyChart", open the app, select [facility], and log in with your MyChart username and password.  Masks are optional in the cancer centers. If you would like for your care team to wear a mask while they are taking care of you, please let them know. For doctor visits, patients may have with them one support person who is at least 52 years old. At this time, visitors are not allowed in the infusion area.  Docetaxel injection What is this medication? DOCETAXEL (doe se TAX el) is a chemotherapy drug. It targets fast dividing cells, like cancer cells, and causes these cells to die. This medicine is used to treat many types of cancers like breast cancer, certain stomach cancers, head and neck cancer, lung  cancer, and prostate cancer. This medicine may be used for other purposes; ask your health care provider or pharmacist if you have questions. COMMON BRAND NAME(S): Docefrez, Taxotere What should I tell my care team before I take this medication? They need to know if you have any of these conditions: infection (especially a virus  infection such as chickenpox, cold sores, or herpes) liver disease low blood counts, like low white cell, platelet, or red cell counts an unusual or allergic reaction to docetaxel, polysorbate 80, other chemotherapy agents, other medicines, foods, dyes, or preservatives pregnant or trying to get pregnant breast-feeding How should I use this medication? This drug is given as an infusion into a vein. It is administered in a hospital or clinic by a specially trained health care professional. Talk to your pediatrician regarding the use of this medicine in children. Special care may be needed. Overdosage: If you think you have taken too much of this medicine contact a poison control center or emergency room at once. NOTE: This medicine is only for you. Do not share this medicine with others. What if I miss a dose? It is important not to miss your dose. Call your doctor or health care professional if you are unable to keep an appointment. What may interact with this medication? Do not take this medicine with any of the following medications: live virus vaccines This medicine may also interact with the following medications: aprepitant certain antibiotics like erythromycin or clarithromycin certain antivirals for HIV or hepatitis certain medicines for fungal infections like fluconazole, itraconazole, ketoconazole, posaconazole, or voriconazole cimetidine ciprofloxacin conivaptan cyclosporine dronedarone fluvoxamine grapefruit juice imatinib verapamil This list may not describe all possible interactions. Give your health care provider a list of all the medicines, herbs, non-prescription drugs, or dietary supplements you use. Also tell them if you smoke, drink alcohol, or use illegal drugs. Some items may interact with your medicine. What should I watch for while using this medication? Your condition will be monitored carefully while you are receiving this medicine. You will need important  blood work done while you are taking this medicine. Call your doctor or health care professional for advice if you get a fever, chills or sore throat, or other symptoms of a cold or flu. Do not treat yourself. This drug decreases your body's ability to fight infections. Try to avoid being around people who are sick. Some products may contain alcohol. Ask your health care professional if this medicine contains alcohol. Be sure to tell all health care professionals you are taking this medicine. Certain medicines, like metronidazole and disulfiram, can cause an unpleasant reaction when taken with alcohol. The reaction includes flushing, headache, nausea, vomiting, sweating, and increased thirst. The reaction can last from 30 minutes to several hours. You may get drowsy or dizzy. Do not drive, use machinery, or do anything that needs mental alertness until you know how this medicine affects you. Do not stand or sit up quickly, especially if you are an older patient. This reduces the risk of dizzy or fainting spells. Alcohol may interfere with the effect of this medicine. Talk to your health care professional about your risk of cancer. You may be more at risk for certain types of cancer if you take this medicine. Do not become pregnant while taking this medicine or for 6 months after stopping it. Women should inform their doctor if they wish to become pregnant or think they might be pregnant. There is a potential for serious side effects to an  unborn child. Talk to your health care professional or pharmacist for more information. Do not breast-feed an infant while taking this medicine or for 1 week after stopping it. Males who get this medicine must use a condom during sex with females who can get pregnant. If you get a woman pregnant, the baby could have birth defects. The baby could die before they are born. You will need to continue wearing a condom for 3 months after stopping the medicine. Tell your health care  provider right away if your partner becomes pregnant while you are taking this medicine. This may interfere with the ability to father a child. You should talk to your doctor or health care professional if you are concerned about your fertility. What side effects may I notice from receiving this medication? Side effects that you should report to your doctor or health care professional as soon as possible: allergic reactions like skin rash, itching or hives, swelling of the face, lips, or tongue blurred vision breathing problems changes in vision low blood counts - This drug may decrease the number of white blood cells, red blood cells and platelets. You may be at increased risk for infections and bleeding. nausea and vomiting pain, redness or irritation at site where injected pain, tingling, numbness in the hands or feet redness, blistering, peeling, or loosening of the skin, including inside the mouth signs of decreased platelets or bleeding - bruising, pinpoint red spots on the skin, black, tarry stools, nosebleeds signs of decreased red blood cells - unusually weak or tired, fainting spells, lightheadedness signs of infection - fever or chills, cough, sore throat, pain or difficulty passing urine swelling of the ankle, feet, hands Side effects that usually do not require medical attention (report to your doctor or health care professional if they continue or are bothersome): constipation diarrhea fingernail or toenail changes hair loss loss of appetite mouth sores muscle pain This list may not describe all possible side effects. Call your doctor for medical advice about side effects. You may report side effects to FDA at 1-800-FDA-1088. Where should I keep my medication? This drug is given in a hospital or clinic and will not be stored at home. NOTE: This sheet is a summary. It may not cover all possible information. If you have questions about this medicine, talk to your doctor,  pharmacist, or health care provider.  2023 Elsevier/Gold Standard (2021-01-02 00:00:00) Cyclophosphamide Injection What is this medication? CYCLOPHOSPHAMIDE (sye kloe FOSS fa mide) is a chemotherapy drug. It slows the growth of cancer cells. This medicine is used to treat many types of cancer like lymphoma, myeloma, leukemia, breast cancer, and ovarian cancer, to name a few. This medicine may be used for other purposes; ask your health care provider or pharmacist if you have questions. COMMON BRAND NAME(S): Cyclophosphamide, Cytoxan, Neosar What should I tell my care team before I take this medication? They need to know if you have any of these conditions: heart disease history of irregular heartbeat infection kidney disease liver disease low blood counts, like white cells, platelets, or red blood cells on hemodialysis recent or ongoing radiation therapy scarring or thickening of the lungs trouble passing urine an unusual or allergic reaction to cyclophosphamide, other medicines, foods, dyes, or preservatives pregnant or trying to get pregnant breast-feeding How should I use this medication? This drug is usually given as an injection into a vein or muscle or by infusion into a vein. It is administered in a hospital or clinic by a specially  trained health care professional. Talk to your pediatrician regarding the use of this medicine in children. Special care may be needed. Overdosage: If you think you have taken too much of this medicine contact a poison control center or emergency room at once. NOTE: This medicine is only for you. Do not share this medicine with others. What if I miss a dose? It is important not to miss your dose. Call your doctor or health care professional if you are unable to keep an appointment. What may interact with this medication? amphotericin B azathioprine certain antivirals for HIV or hepatitis certain medicines for blood pressure, heart disease,  irregular heart beat certain medicines that treat or prevent blood clots like warfarin certain other medicines for cancer cyclosporine etanercept indomethacin medicines that relax muscles for surgery medicines to increase blood counts metronidazole This list may not describe all possible interactions. Give your health care provider a list of all the medicines, herbs, non-prescription drugs, or dietary supplements you use. Also tell them if you smoke, drink alcohol, or use illegal drugs. Some items may interact with your medicine. What should I watch for while using this medication? Your condition will be monitored carefully while you are receiving this medicine. You may need blood work done while you are taking this medicine. Drink water or other fluids as directed. Urinate often, even at night. Some products may contain alcohol. Ask your health care professional if this medicine contains alcohol. Be sure to tell all health care professionals you are taking this medicine. Certain medicines, like metronidazole and disulfiram, can cause an unpleasant reaction when taken with alcohol. The reaction includes flushing, headache, nausea, vomiting, sweating, and increased thirst. The reaction can last from 30 minutes to several hours. Do not become pregnant while taking this medicine or for 1 year after stopping it. Women should inform their health care professional if they wish to become pregnant or think they might be pregnant. Men should not father a child while taking this medicine and for 4 months after stopping it. There is potential for serious side effects to an unborn child. Talk to your health care professional for more information. Do not breast-feed an infant while taking this medicine or for 1 week after stopping it. This medicine has caused ovarian failure in some women. This medicine may make it more difficult to get pregnant. Talk to your health care professional if you are concerned about  your fertility. This medicine has caused decreased sperm counts in some men. This may make it more difficult to father a child. Talk to your health care professional if you are concerned about your fertility. Call your health care professional for advice if you get a fever, chills, or sore throat, or other symptoms of a cold or flu. Do not treat yourself. This medicine decreases your body's ability to fight infections. Try to avoid being around people who are sick. Avoid taking medicines that contain aspirin, acetaminophen, ibuprofen, naproxen, or ketoprofen unless instructed by your health care professional. These medicines may hide a fever. Talk to your health care professional about your risk of cancer. You may be more at risk for certain types of cancer if you take this medicine. If you are going to need surgery or other procedure, tell your health care professional that you are using this medicine. Be careful brushing or flossing your teeth or using a toothpick because you may get an infection or bleed more easily. If you have any dental work done, tell your dentist you  are receiving this medicine. What side effects may I notice from receiving this medication? Side effects that you should report to your doctor or health care professional as soon as possible: allergic reactions like skin rash, itching or hives, swelling of the face, lips, or tongue breathing problems nausea, vomiting signs and symptoms of bleeding such as bloody or black, tarry stools; red or dark brown urine; spitting up blood or brown material that looks like coffee grounds; red spots on the skin; unusual bruising or bleeding from the eyes, gums, or nose signs and symptoms of heart failure like fast, irregular heartbeat, sudden weight gain; swelling of the ankles, feet, hands signs and symptoms of infection like fever; chills; cough; sore throat; pain or trouble passing urine signs and symptoms of kidney injury like trouble  passing urine or change in the amount of urine signs and symptoms of liver injury like dark yellow or brown urine; general ill feeling or flu-like symptoms; light-colored stools; loss of appetite; nausea; right upper belly pain; unusually weak or tired; yellowing of the eyes or skin Side effects that usually do not require medical attention (report to your doctor or health care professional if they continue or are bothersome): confusion decreased hearing diarrhea facial flushing hair loss headache loss of appetite missed menstrual periods signs and symptoms of low red blood cells or anemia such as unusually weak or tired; feeling faint or lightheaded; falls skin discoloration This list may not describe all possible side effects. Call your doctor for medical advice about side effects. You may report side effects to FDA at 1-800-FDA-1088. Where should I keep my medication? This drug is given in a hospital or clinic and will not be stored at home. NOTE: This sheet is a summary. It may not cover all possible information. If you have questions about this medicine, talk to your doctor, pharmacist, or health care provider.  2023 Elsevier/Gold Standard (2021-01-02 00:00:00)

## 2021-08-26 ENCOUNTER — Telehealth: Payer: Self-pay

## 2021-08-26 NOTE — Telephone Encounter (Signed)
LM for patient that this nurse was calling to see how they were doing after their treatment. Please call back to Dr. Iruku's nurse at [phone] if they have any questions or concerns regarding the treatment. 

## 2021-08-26 NOTE — Telephone Encounter (Signed)
-----   Message from Scot Dock, RN sent at 08/25/2021  4:05 PM EDT ----- Regarding: Dr. Chryl Heck first time chemo - tol well call on 7/12

## 2021-08-27 ENCOUNTER — Other Ambulatory Visit: Payer: Self-pay

## 2021-08-27 ENCOUNTER — Inpatient Hospital Stay: Payer: BC Managed Care – PPO

## 2021-08-27 VITALS — BP 116/79 | HR 70 | Temp 98.4°F | Resp 18

## 2021-08-27 DIAGNOSIS — Z5111 Encounter for antineoplastic chemotherapy: Secondary | ICD-10-CM | POA: Diagnosis not present

## 2021-08-27 DIAGNOSIS — Z170 Estrogen receptor positive status [ER+]: Secondary | ICD-10-CM

## 2021-08-27 MED ORDER — PEGFILGRASTIM-CBQV 6 MG/0.6ML ~~LOC~~ SOSY
6.0000 mg | PREFILLED_SYRINGE | Freq: Once | SUBCUTANEOUS | Status: AC
  Administered 2021-08-27: 6 mg via SUBCUTANEOUS
  Filled 2021-08-27: qty 0.6

## 2021-08-27 NOTE — Patient Instructions (Signed)

## 2021-09-06 ENCOUNTER — Other Ambulatory Visit: Payer: Self-pay | Admitting: Internal Medicine

## 2021-09-07 ENCOUNTER — Other Ambulatory Visit: Payer: Self-pay

## 2021-09-07 NOTE — Telephone Encounter (Signed)
Did she ever start this medicine and naltrexone for weight? If so she needs follow up visit. If not, please do not approve refill

## 2021-09-07 NOTE — Telephone Encounter (Signed)
No answer LMOM need f/u appt w/Dr. Sharlet Salina. Denied script.Marland KitchenJohny Ayers

## 2021-09-07 NOTE — Telephone Encounter (Signed)
Rec'd msg original prescription was discontinued on 07/15/2021 by Hayden Pedro, PA-C  pls advise on refill.Marland KitchenJohny Chess

## 2021-09-11 ENCOUNTER — Telehealth: Payer: Self-pay

## 2021-09-11 NOTE — Telephone Encounter (Signed)
Notified Patient of completion of Disability Form. Fax transmission confirmation received from [facility]. Copy of form mailed to Patient as requested. Patient inquired about [employer] leave extension form. Informed Patient that form had been completed and forwarded to Provider for review and signature. No other needs or concerns voiced at this time.

## 2021-09-14 ENCOUNTER — Telehealth: Payer: Self-pay

## 2021-09-14 MED FILL — Dexamethasone Sodium Phosphate Inj 100 MG/10ML: INTRAMUSCULAR | Qty: 1 | Status: AC

## 2021-09-14 NOTE — Telephone Encounter (Signed)
Notified Patient of completion of Leave Update Paperwork. Fax transmission confirmation received. Copy of paperwork mailed to patient as requested. No other needs or concerns voiced at this time.

## 2021-09-15 ENCOUNTER — Inpatient Hospital Stay (HOSPITAL_BASED_OUTPATIENT_CLINIC_OR_DEPARTMENT_OTHER): Payer: BC Managed Care – PPO | Admitting: Physician Assistant

## 2021-09-15 ENCOUNTER — Other Ambulatory Visit: Payer: Self-pay

## 2021-09-15 ENCOUNTER — Inpatient Hospital Stay: Payer: BC Managed Care – PPO | Attending: Hematology and Oncology

## 2021-09-15 ENCOUNTER — Encounter: Payer: Self-pay | Admitting: *Deleted

## 2021-09-15 ENCOUNTER — Inpatient Hospital Stay: Payer: BC Managed Care – PPO

## 2021-09-15 VITALS — BP 129/76 | HR 55 | Temp 97.7°F | Resp 18

## 2021-09-15 VITALS — BP 120/75 | HR 100 | Temp 97.5°F | Resp 20 | Wt 243.0 lb

## 2021-09-15 DIAGNOSIS — C50212 Malignant neoplasm of upper-inner quadrant of left female breast: Secondary | ICD-10-CM | POA: Diagnosis present

## 2021-09-15 DIAGNOSIS — Z170 Estrogen receptor positive status [ER+]: Secondary | ICD-10-CM

## 2021-09-15 DIAGNOSIS — Z5111 Encounter for antineoplastic chemotherapy: Secondary | ICD-10-CM | POA: Diagnosis present

## 2021-09-15 DIAGNOSIS — Z5189 Encounter for other specified aftercare: Secondary | ICD-10-CM | POA: Insufficient documentation

## 2021-09-15 LAB — CBC WITH DIFFERENTIAL (CANCER CENTER ONLY)
Abs Immature Granulocytes: 0.04 10*3/uL (ref 0.00–0.07)
Basophils Absolute: 0 10*3/uL (ref 0.0–0.1)
Basophils Relative: 0 %
Eosinophils Absolute: 0 10*3/uL (ref 0.0–0.5)
Eosinophils Relative: 0 %
HCT: 34.4 % — ABNORMAL LOW (ref 36.0–46.0)
Hemoglobin: 11.3 g/dL — ABNORMAL LOW (ref 12.0–15.0)
Immature Granulocytes: 0 %
Lymphocytes Relative: 8 %
Lymphs Abs: 0.7 10*3/uL (ref 0.7–4.0)
MCH: 30.7 pg (ref 26.0–34.0)
MCHC: 32.8 g/dL (ref 30.0–36.0)
MCV: 93.5 fL (ref 80.0–100.0)
Monocytes Absolute: 0.3 10*3/uL (ref 0.1–1.0)
Monocytes Relative: 3 %
Neutro Abs: 7.9 10*3/uL — ABNORMAL HIGH (ref 1.7–7.7)
Neutrophils Relative %: 89 %
Platelet Count: 443 10*3/uL — ABNORMAL HIGH (ref 150–400)
RBC: 3.68 MIL/uL — ABNORMAL LOW (ref 3.87–5.11)
RDW: 11.9 % (ref 11.5–15.5)
WBC Count: 8.9 10*3/uL (ref 4.0–10.5)
nRBC: 0 % (ref 0.0–0.2)

## 2021-09-15 LAB — CMP (CANCER CENTER ONLY)
ALT: 12 U/L (ref 0–44)
AST: 11 U/L — ABNORMAL LOW (ref 15–41)
Albumin: 4.6 g/dL (ref 3.5–5.0)
Alkaline Phosphatase: 45 U/L (ref 38–126)
Anion gap: 7 (ref 5–15)
BUN: 24 mg/dL — ABNORMAL HIGH (ref 6–20)
CO2: 23 mmol/L (ref 22–32)
Calcium: 9.9 mg/dL (ref 8.9–10.3)
Chloride: 107 mmol/L (ref 98–111)
Creatinine: 0.93 mg/dL (ref 0.44–1.00)
GFR, Estimated: >60 mL/min (ref >60)
Glucose, Bld: 111 mg/dL — ABNORMAL HIGH (ref 70–99)
Potassium: 4 mmol/L (ref 3.5–5.1)
Sodium: 137 mmol/L (ref 135–145)
Total Bilirubin: 0.4 mg/dL (ref 0.3–1.2)
Total Protein: 7.6 g/dL (ref 6.5–8.1)

## 2021-09-15 MED ORDER — SODIUM CHLORIDE 0.9 % IV SOLN
75.0000 mg/m2 | Freq: Once | INTRAVENOUS | Status: AC
  Administered 2021-09-15: 170 mg via INTRAVENOUS
  Filled 2021-09-15: qty 17

## 2021-09-15 MED ORDER — SODIUM CHLORIDE 0.9 % IV SOLN: Freq: Once | INTRAVENOUS | Status: AC

## 2021-09-15 MED ORDER — SODIUM CHLORIDE 0.9 % IV SOLN
600.0000 mg/m2 | Freq: Once | INTRAVENOUS | Status: AC
  Administered 2021-09-15: 1380 mg via INTRAVENOUS
  Filled 2021-09-15: qty 69

## 2021-09-15 MED ORDER — SODIUM CHLORIDE 0.9 % IV SOLN
10.0000 mg | Freq: Once | INTRAVENOUS | Status: AC
  Administered 2021-09-15: 10 mg via INTRAVENOUS
  Filled 2021-09-15: qty 10

## 2021-09-15 MED ORDER — PALONOSETRON HCL INJECTION 0.25 MG/5ML
0.2500 mg | Freq: Once | INTRAVENOUS | Status: AC
  Administered 2021-09-15: 0.25 mg via INTRAVENOUS
  Filled 2021-09-15: qty 5

## 2021-09-15 NOTE — Research (Signed)
DCP-001: Use of a Clinical Trial Screening Tool to Address Cancer Health Disparities in the [facility])   Discussed the above study with patient, briefly reminding patient of the purpose of the study and that it is a one time data collection. Informed patient it is voluntary. Patient declined to participate.  Thanked patient for her time and willingness to discuss this study. Foye Spurling, BSN, RN, [facility] Clinical Research Nurse II 09/15/2021 12:48 PM

## 2021-09-15 NOTE — Patient Instructions (Signed)
[facility]  Discharge Instructions: Thank you for choosing [facility] to provide your oncology and hematology care.   If you have a lab appointment with the [facility], please go directly to the [facility] and check in at the registration area.   Wear comfortable clothing and clothing appropriate for easy access to any Portacath or PICC line.   We strive to give you quality time with your provider. You may need to reschedule your appointment if you arrive late (15 or more minutes).  Arriving late affects you and other patients whose appointments are after yours.  Also, if you miss three or more appointments without notifying the office, you may be dismissed from the clinic at the provider's discretion.      For prescription refill requests, have your pharmacy contact our office and allow 72 hours for refills to be completed.    Today you received the following chemotherapy and/or immunotherapy agents: Docetaxel and Cyclophosphamide.    To help prevent nausea and vomiting after your treatment, we encourage you to take your nausea medication as directed.  BELOW ARE SYMPTOMS THAT SHOULD BE REPORTED IMMEDIATELY: *FEVER GREATER THAN 100.4 F (38 C) OR HIGHER *CHILLS OR SWEATING *NAUSEA AND VOMITING THAT IS NOT CONTROLLED WITH YOUR NAUSEA MEDICATION *UNUSUAL SHORTNESS OF BREATH *UNUSUAL BRUISING OR BLEEDING *URINARY PROBLEMS (pain or burning when urinating, or frequent urination) *BOWEL PROBLEMS (unusual diarrhea, constipation, pain near the anus) TENDERNESS IN MOUTH AND THROAT WITH OR WITHOUT PRESENCE OF ULCERS (sore throat, sores in mouth, or a toothache) UNUSUAL RASH, SWELLING OR PAIN  UNUSUAL VAGINAL DISCHARGE OR ITCHING   Items with * indicate a potential emergency and should be followed up as soon as possible or go to the Emergency Department if any problems should occur.  Please show the CHEMOTHERAPY ALERT CARD or IMMUNOTHERAPY ALERT  CARD at check-in to the Emergency Department and triage nurse.  Should you have questions after your visit or need to cancel or reschedule your appointment, please contact [facility]  Dept: [phone]  and follow the prompts.  Office hours are 8:00 a.m. to 4:30 p.m. Monday - Friday. Please note that voicemails left after 4:00 p.m. may not be returned until the following business day.  We are closed weekends and major holidays. You have access to a nurse at all times for urgent questions. Please call the main number to the clinic Dept: [phone] and follow the prompts.   For any non-urgent questions, you may also contact your provider using MyChart. We now offer e-Visits for anyone 48 and older to request care online for non-urgent symptoms. For details visit [email or web address].   Also download the MyChart app! Go to the app store, search "MyChart", open the app, select [facility], and log in with your MyChart username and password.  Masks are optional in the cancer centers. If you would like for your care team to wear a mask while they are taking care of you, please let them know. For doctor visits, patients may have with them one support person who is at least 52 years old. At this time, visitors are not allowed in the infusion area.  Docetaxel injection What is this medication? DOCETAXEL (doe se TAX el) is a chemotherapy drug. It targets fast dividing cells, like cancer cells, and causes these cells to die. This medicine is used to treat many types of cancers like breast cancer, certain stomach cancers, head and neck cancer, lung  cancer, and prostate cancer. This medicine may be used for other purposes; ask your health care provider or pharmacist if you have questions. COMMON BRAND NAME(S): Docefrez, Taxotere What should I tell my care team before I take this medication? They need to know if you have any of these conditions: infection (especially a virus  infection such as chickenpox, cold sores, or herpes) liver disease low blood counts, like low white cell, platelet, or red cell counts an unusual or allergic reaction to docetaxel, polysorbate 80, other chemotherapy agents, other medicines, foods, dyes, or preservatives pregnant or trying to get pregnant breast-feeding How should I use this medication? This drug is given as an infusion into a vein. It is administered in a hospital or clinic by a specially trained health care professional. Talk to your pediatrician regarding the use of this medicine in children. Special care may be needed. Overdosage: If you think you have taken too much of this medicine contact a poison control center or emergency room at once. NOTE: This medicine is only for you. Do not share this medicine with others. What if I miss a dose? It is important not to miss your dose. Call your doctor or health care professional if you are unable to keep an appointment. What may interact with this medication? Do not take this medicine with any of the following medications: live virus vaccines This medicine may also interact with the following medications: aprepitant certain antibiotics like erythromycin or clarithromycin certain antivirals for HIV or hepatitis certain medicines for fungal infections like fluconazole, itraconazole, ketoconazole, posaconazole, or voriconazole cimetidine ciprofloxacin conivaptan cyclosporine dronedarone fluvoxamine grapefruit juice imatinib verapamil This list may not describe all possible interactions. Give your health care provider a list of all the medicines, herbs, non-prescription drugs, or dietary supplements you use. Also tell them if you smoke, drink alcohol, or use illegal drugs. Some items may interact with your medicine. What should I watch for while using this medication? Your condition will be monitored carefully while you are receiving this medicine. You will need important  blood work done while you are taking this medicine. Call your doctor or health care professional for advice if you get a fever, chills or sore throat, or other symptoms of a cold or flu. Do not treat yourself. This drug decreases your body's ability to fight infections. Try to avoid being around people who are sick. Some products may contain alcohol. Ask your health care professional if this medicine contains alcohol. Be sure to tell all health care professionals you are taking this medicine. Certain medicines, like metronidazole and disulfiram, can cause an unpleasant reaction when taken with alcohol. The reaction includes flushing, headache, nausea, vomiting, sweating, and increased thirst. The reaction can last from 30 minutes to several hours. You may get drowsy or dizzy. Do not drive, use machinery, or do anything that needs mental alertness until you know how this medicine affects you. Do not stand or sit up quickly, especially if you are an older patient. This reduces the risk of dizzy or fainting spells. Alcohol may interfere with the effect of this medicine. Talk to your health care professional about your risk of cancer. You may be more at risk for certain types of cancer if you take this medicine. Do not become pregnant while taking this medicine or for 6 months after stopping it. Women should inform their doctor if they wish to become pregnant or think they might be pregnant. There is a potential for serious side effects to an  unborn child. Talk to your health care professional or pharmacist for more information. Do not breast-feed an infant while taking this medicine or for 1 week after stopping it. Males who get this medicine must use a condom during sex with females who can get pregnant. If you get a woman pregnant, the baby could have birth defects. The baby could die before they are born. You will need to continue wearing a condom for 3 months after stopping the medicine. Tell your health care  provider right away if your partner becomes pregnant while you are taking this medicine. This may interfere with the ability to father a child. You should talk to your doctor or health care professional if you are concerned about your fertility. What side effects may I notice from receiving this medication? Side effects that you should report to your doctor or health care professional as soon as possible: allergic reactions like skin rash, itching or hives, swelling of the face, lips, or tongue blurred vision breathing problems changes in vision low blood counts - This drug may decrease the number of white blood cells, red blood cells and platelets. You may be at increased risk for infections and bleeding. nausea and vomiting pain, redness or irritation at site where injected pain, tingling, numbness in the hands or feet redness, blistering, peeling, or loosening of the skin, including inside the mouth signs of decreased platelets or bleeding - bruising, pinpoint red spots on the skin, black, tarry stools, nosebleeds signs of decreased red blood cells - unusually weak or tired, fainting spells, lightheadedness signs of infection - fever or chills, cough, sore throat, pain or difficulty passing urine swelling of the ankle, feet, hands Side effects that usually do not require medical attention (report to your doctor or health care professional if they continue or are bothersome): constipation diarrhea fingernail or toenail changes hair loss loss of appetite mouth sores muscle pain This list may not describe all possible side effects. Call your doctor for medical advice about side effects. You may report side effects to FDA at 1-800-FDA-1088. Where should I keep my medication? This drug is given in a hospital or clinic and will not be stored at home. NOTE: This sheet is a summary. It may not cover all possible information. If you have questions about this medicine, talk to your doctor,  pharmacist, or health care provider.  2023 Elsevier/Gold Standard (2021-01-02 00:00:00) Cyclophosphamide Injection What is this medication? CYCLOPHOSPHAMIDE (sye kloe FOSS fa mide) is a chemotherapy drug. It slows the growth of cancer cells. This medicine is used to treat many types of cancer like lymphoma, myeloma, leukemia, breast cancer, and ovarian cancer, to name a few. This medicine may be used for other purposes; ask your health care provider or pharmacist if you have questions. COMMON BRAND NAME(S): Cyclophosphamide, Cytoxan, Neosar What should I tell my care team before I take this medication? They need to know if you have any of these conditions: heart disease history of irregular heartbeat infection kidney disease liver disease low blood counts, like white cells, platelets, or red blood cells on hemodialysis recent or ongoing radiation therapy scarring or thickening of the lungs trouble passing urine an unusual or allergic reaction to cyclophosphamide, other medicines, foods, dyes, or preservatives pregnant or trying to get pregnant breast-feeding How should I use this medication? This drug is usually given as an injection into a vein or muscle or by infusion into a vein. It is administered in a hospital or clinic by a specially  trained health care professional. Talk to your pediatrician regarding the use of this medicine in children. Special care may be needed. Overdosage: If you think you have taken too much of this medicine contact a poison control center or emergency room at once. NOTE: This medicine is only for you. Do not share this medicine with others. What if I miss a dose? It is important not to miss your dose. Call your doctor or health care professional if you are unable to keep an appointment. What may interact with this medication? amphotericin B azathioprine certain antivirals for HIV or hepatitis certain medicines for blood pressure, heart disease,  irregular heart beat certain medicines that treat or prevent blood clots like warfarin certain other medicines for cancer cyclosporine etanercept indomethacin medicines that relax muscles for surgery medicines to increase blood counts metronidazole This list may not describe all possible interactions. Give your health care provider a list of all the medicines, herbs, non-prescription drugs, or dietary supplements you use. Also tell them if you smoke, drink alcohol, or use illegal drugs. Some items may interact with your medicine. What should I watch for while using this medication? Your condition will be monitored carefully while you are receiving this medicine. You may need blood work done while you are taking this medicine. Drink water or other fluids as directed. Urinate often, even at night. Some products may contain alcohol. Ask your health care professional if this medicine contains alcohol. Be sure to tell all health care professionals you are taking this medicine. Certain medicines, like metronidazole and disulfiram, can cause an unpleasant reaction when taken with alcohol. The reaction includes flushing, headache, nausea, vomiting, sweating, and increased thirst. The reaction can last from 30 minutes to several hours. Do not become pregnant while taking this medicine or for 1 year after stopping it. Women should inform their health care professional if they wish to become pregnant or think they might be pregnant. Men should not father a child while taking this medicine and for 4 months after stopping it. There is potential for serious side effects to an unborn child. Talk to your health care professional for more information. Do not breast-feed an infant while taking this medicine or for 1 week after stopping it. This medicine has caused ovarian failure in some women. This medicine may make it more difficult to get pregnant. Talk to your health care professional if you are concerned about  your fertility. This medicine has caused decreased sperm counts in some men. This may make it more difficult to father a child. Talk to your health care professional if you are concerned about your fertility. Call your health care professional for advice if you get a fever, chills, or sore throat, or other symptoms of a cold or flu. Do not treat yourself. This medicine decreases your body's ability to fight infections. Try to avoid being around people who are sick. Avoid taking medicines that contain aspirin, acetaminophen, ibuprofen, naproxen, or ketoprofen unless instructed by your health care professional. These medicines may hide a fever. Talk to your health care professional about your risk of cancer. You may be more at risk for certain types of cancer if you take this medicine. If you are going to need surgery or other procedure, tell your health care professional that you are using this medicine. Be careful brushing or flossing your teeth or using a toothpick because you may get an infection or bleed more easily. If you have any dental work done, tell your dentist you  are receiving this medicine. What side effects may I notice from receiving this medication? Side effects that you should report to your doctor or health care professional as soon as possible: allergic reactions like skin rash, itching or hives, swelling of the face, lips, or tongue breathing problems nausea, vomiting signs and symptoms of bleeding such as bloody or black, tarry stools; red or dark brown urine; spitting up blood or brown material that looks like coffee grounds; red spots on the skin; unusual bruising or bleeding from the eyes, gums, or nose signs and symptoms of heart failure like fast, irregular heartbeat, sudden weight gain; swelling of the ankles, feet, hands signs and symptoms of infection like fever; chills; cough; sore throat; pain or trouble passing urine signs and symptoms of kidney injury like trouble  passing urine or change in the amount of urine signs and symptoms of liver injury like dark yellow or brown urine; general ill feeling or flu-like symptoms; light-colored stools; loss of appetite; nausea; right upper belly pain; unusually weak or tired; yellowing of the eyes or skin Side effects that usually do not require medical attention (report to your doctor or health care professional if they continue or are bothersome): confusion decreased hearing diarrhea facial flushing hair loss headache loss of appetite missed menstrual periods signs and symptoms of low red blood cells or anemia such as unusually weak or tired; feeling faint or lightheaded; falls skin discoloration This list may not describe all possible side effects. Call your doctor for medical advice about side effects. You may report side effects to FDA at 1-800-FDA-1088. Where should I keep my medication? This drug is given in a hospital or clinic and will not be stored at home. NOTE: This sheet is a summary. It may not cover all possible information. If you have questions about this medicine, talk to your doctor, pharmacist, or health care provider.  2023 Elsevier/Gold Standard (2021-01-02 00:00:00)

## 2021-09-15 NOTE — Progress Notes (Signed)
[facility] PROGRESS NOTE  Patient Care Team: Hoyt Koch, MD as PCP - General (Internal Medicine) Stark Klein, MD as Consulting Physician (General Surgery) Benay Pike, MD as Consulting Physician (Hematology and Oncology) Kyung Rudd, MD as Consulting Physician (Radiation Oncology) Mauro Kaufmann, RN as Oncology Nurse Navigator Rockwell Germany, RN as Oncology Nurse Navigator  CHIEF COMPLAINTS/PURPOSE OF CONSULTATION:  Breast cancer  SUMMARY OF ONCOLOGIC HISTORY: Oncology History  Malignant neoplasm of upper-inner quadrant of left breast in female, estrogen receptor positive ([place])  06/19/2021 Mammogram   Mammogram from Jun 19, 2021 showed possible asymmetry in the left breast.  Diagnostic mammogram showed suspicious mass in the 10:00 location of the left breast.  Ultrasound of the left axilla negative for lymphadenopathy   07/14/2021 Initial Diagnosis   Malignant neoplasm of upper-inner quadrant of left breast in female, estrogen receptor positive [facility])    Pathology Results   Pathology from the left breast showed invasive ductal carcinoma grade 2 out of 3.  Prognostic showed ER 90% positive strong staining, PR 95% strong staining, Ki-67 of 60% and HER2 equivocal by IHC and FISH is pending   07/21/2021 Definitive Surgery   Left breast lumpectomy showed invasive ductal carcinoma measuring 8 mm, grade 3, focal high-grade DCIS, negative resection margins negative for lymphovascular or perineural invasion, all lymph nodes with no evidence of involvement.    Oncotype testing   Oncotype testing resulted at score of 29, distant risk of recurrence at 9 years of approximately 18%, absolute benefit of chemotherapy greater than 15%   07/21/2021 Oncotype testing   Oncotype score of 29, distant recurrence risk at 9 years was thought to be 18% with antiestrogen therapy alone.  There is demonstrated group average absolute chemotherapy benefit in this arm of greater than  15%.   08/25/2021 -  Chemotherapy   Patient is on Treatment Plan : BREAST TC q21d      Interval History [name] presents for follow-up before cycle 2, day 1 of Taxotere and Cytoxan. She tolerated the first treatment without any significant limitations.  She experience appetite loss for a few hours following the treatment which resolved on its own.  Her appetite has been stable since then and she denies any weight loss.  Her energy levels are stable and she continues to complete her ADLs on her own.  She denies any nausea, vomiting or abdominal pain.  Her bowel habits are unchanged without any recurrent episodes of diarrhea or constipation.  She did notice hyperpigmentation on her right forearm without any pruritus, peeling of skin.  She has no other skin complaints.  Patient denies fevers, chills, night sweats, shortness of breath, chest pain or cough.  She has no other complaints.  Rest of the pertinent 10 point ROS reviewed and negative  MEDICAL HISTORY:  Past Medical History:  Diagnosis Date   Arthritis    R knee   Malignant neoplasm of upper-inner quadrant of left breast in female, estrogen receptor positive ([name]) 07/14/2021    SURGICAL HISTORY: Past Surgical History:  Procedure Laterality Date   BREAST LUMPECTOMY WITH RADIOACTIVE SEED AND SENTINEL LYMPH NODE BIOPSY Left 07/21/2021   Procedure: LEFT BREAST LUMPECTOMY WITH RADIOACTIVE SEED AND SENTINEL LYMPH NODE BIOPSY;  Surgeon: Stark Klein, MD;  Location: [place];  Service: General;  Laterality: Left;    SOCIAL HISTORY: Social History   Socioeconomic History   Marital status: Single    Spouse name: Not on file  Number of children: Not on file   Years of education: Not on file   Highest education level: Not on file  Occupational History   Not on file  Tobacco Use   Smoking status: Never    Passive exposure: Never   Smokeless tobacco: Never  Substance and Sexual Activity   Alcohol use: Not Currently     Alcohol/week: 1.0 standard drink of alcohol    Types: 1 Cans of beer per week   Drug use: Never   Sexual activity: Yes    Birth control/protection: None  Other Topics Concern   Not on file  Social History Narrative   Not on file   Social Determinants of Health   Financial Resource Strain: Not on file  Food Insecurity: Not on file  Transportation Needs: Not on file  Physical Activity: Not on file  Stress: Not on file  Social Connections: Not on file  Intimate Partner Violence: Not on file    FAMILY HISTORY: Family History  Problem Relation Age of Onset   Multiple myeloma Maternal Uncle        d. 90s   Uterine cancer Maternal Grandmother        dx after age 2    ALLERGIES:  is allergic to prednisone.  MEDICATIONS:  Current Outpatient Medications  Medication Sig Dispense Refill   dexamethasone (DECADRON) 4 MG tablet Take 2 tablets (8 mg total) by mouth 2 (two) times daily. Start the day before Taxotere. Then again the day after chemo for 3 days. 30 tablet 1   HYDROcodone-acetaminophen (NORCO/VICODIN) 5-325 MG tablet Take 1 tablet by mouth every 6 (six) hours as needed for moderate pain. 15 tablet 0   Ibuprofen-diphenhydrAMINE Cit (ADVIL PM PO) Take by mouth at bedtime as needed.     lidocaine-prilocaine (EMLA) cream Apply to affected area once 30 g 3   ondansetron (ZOFRAN) 8 MG tablet Take 1 tablet (8 mg total) by mouth 2 (two) times daily as needed for refractory nausea / vomiting. Start on day 3 after chemo. 30 tablet 1   prochlorperazine (COMPAZINE) 10 MG tablet Take 1 tablet (10 mg total) by mouth every 6 (six) hours as needed (Nausea or vomiting). 30 tablet 1   No current facility-administered medications for this visit.     PHYSICAL EXAMINATION: ECOG PERFORMANCE STATUS: 0 - Asymptomatic  Physical Exam Constitutional:      Appearance: Normal appearance.  Cardiovascular:     Rate and Rhythm: Normal rate and regular rhythm.  Pulmonary:     Effort: Pulmonary  effort is normal.     Breath sounds: Normal breath sounds.  Musculoskeletal:        General: No swelling.     Cervical back: Normal range of motion and neck supple. No rigidity.  Lymphadenopathy:     Cervical: No cervical adenopathy.  Skin:    General: Skin is warm.     Findings: Bruising (in right forearm) present.  Neurological:     General: No focal deficit present.     Mental Status: She is alert.    LABORATORY DATA:  I have reviewed the data as listed Lab Results  Component Value Date   WBC 8.9 09/15/2021   HGB 11.3 (L) 09/15/2021   HCT 34.4 (L) 09/15/2021   MCV 93.5 09/15/2021   PLT 443 (H) 09/15/2021   Lab Results  Component Value Date   NA 137 09/15/2021   K 4.0 09/15/2021   CL 107 09/15/2021   CO2 23 09/15/2021  RADIOGRAPHIC STUDIES: I have personally reviewed the radiological reports and agreed with the findings in the report.  ASSESSMENT AND PLAN:  Amanda Ayers is a 51 y.o. female who presents to the clinic for continued management for left breast cancer.  #Malignant neoplasm of upper-inner quadrant of left breast --Grade 2 out of 3, ER +90% strong staining PR 95% strong staining, Ki-67 of 60% and HER2 equivocal by IHC and FISH is negative. --S/P  left breast lumpectomy with final pathology showing 8 mm grade 3 IDC, negative margins, no evidence of lymph node involvement. --Oncotype score resulted at 29, distant risk of recurrence at 9 years of 18% and absolute benefit of chemotherapy greater than 15%. --Recommend adjuvant chemotherapy with Taxotere and Cytoxan every 21 days x 4 cycles. GCSF injection will be give on Day 3 to prevent neutropenia. Started chemotherapy on 08/25/2021. --Patient presents today for Cycle 2, Day 1 of Taxotere and Cytoxan. Labs from today were reviewed and adequate for treatment. Patient has no prohibitive toxicities. Okay to proceed with treatment as planned without any dose modifications.  --RTC on 10/06/2021 for labs, f/u visit  with Dr. Chryl Heck prior to Cycle 3, Day 1 of chemotherapy.   All questions were answered. The patient knows to call the clinic with any problems, questions or concerns.   I have spent a total of 30 minutes minutes of face-to-face and non-face-to-face time, preparing to see the patient,  performing a medically appropriate examination, counseling and educating the patient, documenting clinical information in the electronic health record, and care coordination.   Dede Query PA-C Dept of Hematology and [place] at [facility] Phone: [phone]

## 2021-09-17 ENCOUNTER — Inpatient Hospital Stay: Payer: BC Managed Care – PPO

## 2021-09-17 ENCOUNTER — Other Ambulatory Visit: Payer: Self-pay

## 2021-09-17 VITALS — BP 121/77 | HR 58 | Temp 98.6°F | Resp 16

## 2021-09-17 DIAGNOSIS — C50212 Malignant neoplasm of upper-inner quadrant of left female breast: Secondary | ICD-10-CM

## 2021-09-17 MED ORDER — PEGFILGRASTIM-CBQV 6 MG/0.6ML ~~LOC~~ SOSY
6.0000 mg | PREFILLED_SYRINGE | Freq: Once | SUBCUTANEOUS | Status: AC
  Administered 2021-09-17: 6 mg via SUBCUTANEOUS
  Filled 2021-09-17: qty 0.6

## 2021-10-05 MED FILL — Dexamethasone Sodium Phosphate Inj 100 MG/10ML: INTRAMUSCULAR | Qty: 1 | Status: AC

## 2021-10-06 ENCOUNTER — Encounter: Payer: Self-pay | Admitting: Hematology and Oncology

## 2021-10-06 ENCOUNTER — Other Ambulatory Visit: Payer: Self-pay

## 2021-10-06 ENCOUNTER — Inpatient Hospital Stay: Payer: BC Managed Care – PPO

## 2021-10-06 ENCOUNTER — Inpatient Hospital Stay (HOSPITAL_BASED_OUTPATIENT_CLINIC_OR_DEPARTMENT_OTHER): Payer: BC Managed Care – PPO | Admitting: Hematology and Oncology

## 2021-10-06 VITALS — BP 115/67 | HR 64 | Temp 97.9°F | Resp 16 | Ht 68.0 in | Wt 244.8 lb

## 2021-10-06 DIAGNOSIS — C50212 Malignant neoplasm of upper-inner quadrant of left female breast: Secondary | ICD-10-CM

## 2021-10-06 DIAGNOSIS — Z170 Estrogen receptor positive status [ER+]: Secondary | ICD-10-CM

## 2021-10-06 LAB — CMP (CANCER CENTER ONLY)
ALT: 12 U/L (ref 0–44)
AST: 10 U/L — ABNORMAL LOW (ref 15–41)
Albumin: 4.5 g/dL (ref 3.5–5.0)
Alkaline Phosphatase: 46 U/L (ref 38–126)
Anion gap: 5 (ref 5–15)
BUN: 21 mg/dL — ABNORMAL HIGH (ref 6–20)
CO2: 28 mmol/L (ref 22–32)
Calcium: 10.4 mg/dL — ABNORMAL HIGH (ref 8.9–10.3)
Chloride: 107 mmol/L (ref 98–111)
Creatinine: 1.05 mg/dL — ABNORMAL HIGH (ref 0.44–1.00)
GFR, Estimated: >60 mL/min (ref >60)
Glucose, Bld: 102 mg/dL — ABNORMAL HIGH (ref 70–99)
Potassium: 4.2 mmol/L (ref 3.5–5.1)
Sodium: 140 mmol/L (ref 135–145)
Total Bilirubin: 0.5 mg/dL (ref 0.3–1.2)
Total Protein: 6.9 g/dL (ref 6.5–8.1)

## 2021-10-06 LAB — CBC WITH DIFFERENTIAL (CANCER CENTER ONLY)
Abs Immature Granulocytes: 0.06 10*3/uL (ref 0.00–0.07)
Basophils Absolute: 0 10*3/uL (ref 0.0–0.1)
Basophils Relative: 0 %
Eosinophils Absolute: 0 10*3/uL (ref 0.0–0.5)
Eosinophils Relative: 0 %
HCT: 31.6 % — ABNORMAL LOW (ref 36.0–46.0)
Hemoglobin: 10.6 g/dL — ABNORMAL LOW (ref 12.0–15.0)
Immature Granulocytes: 1 %
Lymphocytes Relative: 12 %
Lymphs Abs: 1.4 10*3/uL (ref 0.7–4.0)
MCH: 31.5 pg (ref 26.0–34.0)
MCHC: 33.5 g/dL (ref 30.0–36.0)
MCV: 93.8 fL (ref 80.0–100.0)
Monocytes Absolute: 0.8 10*3/uL (ref 0.1–1.0)
Monocytes Relative: 7 %
Neutro Abs: 9.4 10*3/uL — ABNORMAL HIGH (ref 1.7–7.7)
Neutrophils Relative %: 80 %
Platelet Count: 462 10*3/uL — ABNORMAL HIGH (ref 150–400)
RBC: 3.37 MIL/uL — ABNORMAL LOW (ref 3.87–5.11)
RDW: 13.2 % (ref 11.5–15.5)
WBC Count: 11.7 10*3/uL — ABNORMAL HIGH (ref 4.0–10.5)
nRBC: 0.4 % — ABNORMAL HIGH (ref 0.0–0.2)

## 2021-10-06 MED ORDER — SODIUM CHLORIDE 0.9 % IV SOLN
75.0000 mg/m2 | Freq: Once | INTRAVENOUS | Status: AC
  Administered 2021-10-06: 170 mg via INTRAVENOUS
  Filled 2021-10-06: qty 17

## 2021-10-06 MED ORDER — PALONOSETRON HCL INJECTION 0.25 MG/5ML
0.2500 mg | Freq: Once | INTRAVENOUS | Status: AC
  Administered 2021-10-06: 0.25 mg via INTRAVENOUS
  Filled 2021-10-06: qty 5

## 2021-10-06 MED ORDER — SODIUM CHLORIDE 0.9 % IV SOLN
600.0000 mg/m2 | Freq: Once | INTRAVENOUS | Status: AC
  Administered 2021-10-06: 1380 mg via INTRAVENOUS
  Filled 2021-10-06: qty 69

## 2021-10-06 MED ORDER — SODIUM CHLORIDE 0.9 % IV SOLN: Freq: Once | INTRAVENOUS | Status: AC

## 2021-10-06 MED ORDER — SODIUM CHLORIDE 0.9 % IV SOLN
10.0000 mg | Freq: Once | INTRAVENOUS | Status: AC
  Administered 2021-10-06: 10 mg via INTRAVENOUS
  Filled 2021-10-06: qty 10

## 2021-10-06 NOTE — Patient Instructions (Signed)
[facility] ONCOLOGY  Discharge Instructions: Thank you for choosing [facility] to provide your oncology and hematology care.   If you have a lab appointment with the [facility], please go directly to the [facility] and check in at the registration area.   Wear comfortable clothing and clothing appropriate for easy access to any Portacath or PICC line.   We strive to give you quality time with your provider. You may need to reschedule your appointment if you arrive late (15 or more minutes).  Arriving late affects you and other patients whose appointments are after yours.  Also, if you miss three or more appointments without notifying the office, you may be dismissed from the clinic at the provider's discretion.      For prescription refill requests, have your pharmacy contact our office and allow 72 hours for refills to be completed.    Today you received the following chemotherapy and/or immunotherapy agent: Taxotere, cytoxan      To help prevent nausea and vomiting after your treatment, we encourage you to take your nausea medication as directed.  BELOW ARE SYMPTOMS THAT SHOULD BE REPORTED IMMEDIATELY: *FEVER GREATER THAN 100.4 F (38 C) OR HIGHER *CHILLS OR SWEATING *NAUSEA AND VOMITING THAT IS NOT CONTROLLED WITH YOUR NAUSEA MEDICATION *UNUSUAL SHORTNESS OF BREATH *UNUSUAL BRUISING OR BLEEDING *URINARY PROBLEMS (pain or burning when urinating, or frequent urination) *BOWEL PROBLEMS (unusual diarrhea, constipation, pain near the anus) TENDERNESS IN MOUTH AND THROAT WITH OR WITHOUT PRESENCE OF ULCERS (sore throat, sores in mouth, or a toothache) UNUSUAL RASH, SWELLING OR PAIN  UNUSUAL VAGINAL DISCHARGE OR ITCHING   Items with * indicate a potential emergency and should be followed up as soon as possible or go to the Emergency Department if any problems should occur.  Please show the CHEMOTHERAPY ALERT CARD or IMMUNOTHERAPY ALERT CARD at  check-in to the Emergency Department and triage nurse.  Should you have questions after your visit or need to cancel or reschedule your appointment, please contact [facility]  Dept: [phone]  and follow the prompts.  Office hours are 8:00 a.m. to 4:30 p.m. Monday - Friday. Please note that voicemails left after 4:00 p.m. may not be returned until the following business day.  We are closed weekends and major holidays. You have access to a nurse at all times for urgent questions. Please call the main number to the clinic Dept: [phone] and follow the prompts.   For any non-urgent questions, you may also contact your provider using MyChart. We now offer e-Visits for anyone 24 and older to request care online for non-urgent symptoms. For details visit [email or web address].   Also download the MyChart app! Go to the app store, search "MyChart", open the app, select [facility], and log in with your MyChart username and password.  Masks are optional in the cancer centers. If you would like for your care team to wear a mask while they are taking care of you, please let them know. You may have one support person who is at least 52 years old accompany you for your appointments.

## 2021-10-06 NOTE — Progress Notes (Signed)
[facility] PROGRESS NOTE  Patient Care Team: Hoyt Koch, MD as PCP - General (Internal Medicine) Stark Klein, MD as Consulting Physician (General Surgery) Benay Pike, MD as Consulting Physician (Hematology and Oncology) Kyung Rudd, MD as Consulting Physician (Radiation Oncology) Mauro Kaufmann, RN as Oncology Nurse Navigator Rockwell Germany, RN as Oncology Nurse Navigator  CHIEF COMPLAINTS/PURPOSE OF CONSULTATION:  Breast cancer  SUMMARY OF ONCOLOGIC HISTORY: Oncology History  Malignant neoplasm of upper-inner quadrant of left breast in female, estrogen receptor positive ([place])  06/19/2021 Mammogram   Mammogram from Jun 19, 2021 showed possible asymmetry in the left breast.  Diagnostic mammogram showed suspicious mass in the 10:00 location of the left breast.  Ultrasound of the left axilla negative for lymphadenopathy   07/14/2021 Initial Diagnosis   Malignant neoplasm of upper-inner quadrant of left breast in female, estrogen receptor positive [facility])    Pathology Results   Pathology from the left breast showed invasive ductal carcinoma grade 2 out of 3.  Prognostic showed ER 90% positive strong staining, PR 95% strong staining, Ki-67 of 60% and HER2 equivocal by IHC and FISH is pending   07/21/2021 Definitive Surgery   Left breast lumpectomy showed invasive ductal carcinoma measuring 8 mm, grade 3, focal high-grade DCIS, negative resection margins negative for lymphovascular or perineural invasion, all lymph nodes with no evidence of involvement.    Oncotype testing   Oncotype testing resulted at score of 29, distant risk of recurrence at 9 years of approximately 18%, absolute benefit of chemotherapy greater than 15%   07/21/2021 Oncotype testing   Oncotype score of 29, distant recurrence risk at 9 years was thought to be 18% with antiestrogen therapy alone.  There is demonstrated group average absolute chemotherapy benefit in this arm of greater than  15%.   08/25/2021 -  Chemotherapy   Patient is on Treatment Plan : BREAST TC q21d      Interval History Ms. Shuey presents for follow-up before cycle 3, day 1 of Taxotere and Cytoxan. Since last visit, she had a couple days of fatigue especially after the growth factor shot.  Besides that she noticed a skin rash at the site of previous chemotherapy injection.  She had alopecia after the second cycle. She denies any nausea, vomiting or abdominal pain. Rest of the pertinent 10 point ROS reviewed and negative  MEDICAL HISTORY:  Past Medical History:  Diagnosis Date   Arthritis    R knee   Malignant neoplasm of upper-inner quadrant of left breast in female, estrogen receptor positive ([place]) 07/14/2021    SURGICAL HISTORY: Past Surgical History:  Procedure Laterality Date   BREAST LUMPECTOMY WITH RADIOACTIVE SEED AND SENTINEL LYMPH NODE BIOPSY Left 07/21/2021   Procedure: LEFT BREAST LUMPECTOMY WITH RADIOACTIVE SEED AND SENTINEL LYMPH NODE BIOPSY;  Surgeon: Stark Klein, MD;  Location: [facility];  Service: General;  Laterality: Left;    SOCIAL HISTORY: Social History   Socioeconomic History   Marital status: Single    Spouse name: Not on file   Number of children: Not on file   Years of education: Not on file   Highest education level: Not on file  Occupational History   Not on file  Tobacco Use   Smoking status: Never    Passive exposure: Never   Smokeless tobacco: Never  Substance and Sexual Activity   Alcohol use: Not Currently    Alcohol/week: 1.0 standard drink of alcohol    Types: 1 Cans  of beer per week   Drug use: Never   Sexual activity: Yes    Birth control/protection: None  Other Topics Concern   Not on file  Social History Narrative   Not on file   Social Determinants of Health   Financial Resource Strain: Not on file  Food Insecurity: Not on file  Transportation Needs: Not on file  Physical Activity: Not on file  Stress: Not on file   Social Connections: Not on file  Intimate Partner Violence: Not on file    FAMILY HISTORY: Family History  Problem Relation Age of Onset   Multiple myeloma Maternal Uncle        d. 55s   Uterine cancer Maternal Grandmother        dx after age 54    ALLERGIES:  is allergic to prednisone.  MEDICATIONS:  Current Outpatient Medications  Medication Sig Dispense Refill   dexamethasone (DECADRON) 4 MG tablet Take 2 tablets (8 mg total) by mouth 2 (two) times daily. Start the day before Taxotere. Then again the day after chemo for 3 days. 30 tablet 1   HYDROcodone-acetaminophen (NORCO/VICODIN) 5-325 MG tablet Take 1 tablet by mouth every 6 (six) hours as needed for moderate pain. 15 tablet 0   Ibuprofen-diphenhydrAMINE Cit (ADVIL PM PO) Take by mouth at bedtime as needed.     lidocaine-prilocaine (EMLA) cream Apply to affected area once 30 g 3   ondansetron (ZOFRAN) 8 MG tablet Take 1 tablet (8 mg total) by mouth 2 (two) times daily as needed for refractory nausea / vomiting. Start on day 3 after chemo. 30 tablet 1   prochlorperazine (COMPAZINE) 10 MG tablet Take 1 tablet (10 mg total) by mouth every 6 (six) hours as needed (Nausea or vomiting). 30 tablet 1   No current facility-administered medications for this visit.   Facility-Administered Medications Ordered in Other Visits  Medication Dose Route Frequency Provider Last Rate Last Admin   0.9 %  sodium chloride infusion   Intravenous Once Raidyn Breiner, MD       cyclophosphamide (CYTOXAN) 1,380 mg in sodium chloride 0.9 % 250 mL chemo infusion  600 mg/m2 (Treatment Plan Recorded) Intravenous Once Francesco Provencal, Arletha Pili, MD       dexamethasone (DECADRON) 10 mg in sodium chloride 0.9 % 50 mL IVPB  10 mg Intravenous Once Dacy Enrico, MD       DOCEtaxel (TAXOTERE) 170 mg in sodium chloride 0.9 % 250 mL chemo infusion  75 mg/m2 (Treatment Plan Recorded) Intravenous Once Zamiyah Resendes, MD       palonosetron (ALOXI) injection 0.25 mg  0.25 mg  Intravenous Once Cache Bills, Arletha Pili, MD         PHYSICAL EXAMINATION: ECOG PERFORMANCE STATUS: 0 - Asymptomatic  Physical Exam Constitutional:      Appearance: Normal appearance.  Cardiovascular:     Rate and Rhythm: Normal rate and regular rhythm.  Pulmonary:     Effort: Pulmonary effort is normal.     Breath sounds: Normal breath sounds.  Musculoskeletal:        General: No swelling.     Cervical back: Normal range of motion and neck supple. No rigidity.  Lymphadenopathy:     Cervical: No cervical adenopathy.  Skin:    General: Skin is warm.     Findings: No bruising (Area of skin desquamation at the site of previous chemotherapy infusion, very superficial.).  Neurological:     General: No focal deficit present.     Mental Status: She is  alert.    LABORATORY DATA:  I have reviewed the data as listed Lab Results  Component Value Date   WBC 11.7 (H) 10/06/2021   HGB 10.6 (L) 10/06/2021   HCT 31.6 (L) 10/06/2021   MCV 93.8 10/06/2021   PLT 462 (H) 10/06/2021   Lab Results  Component Value Date   NA 140 10/06/2021   K 4.2 10/06/2021   CL 107 10/06/2021   CO2 28 10/06/2021    RADIOGRAPHIC STUDIES: I have personally reviewed the radiological reports and agreed with the findings in the report.  ASSESSMENT AND PLAN:  MARTESHA NIEDERMEIER is a 51 y.o. female who presents to the clinic for continued management for left breast cancer.  #Malignant neoplasm of upper-inner quadrant of left breast --Grade 2 out of 3, ER +90% strong staining PR 95% strong staining, Ki-67 of 60% and HER2 equivocal by IHC and FISH is negative. --S/P  left breast lumpectomy with final pathology showing 8 mm grade 3 IDC, negative margins, no evidence of lymph node involvement. --Oncotype score resulted at 29, distant risk of recurrence at 9 years of 18% and absolute benefit of chemotherapy greater than 15%. --Recommend adjuvant chemotherapy with Taxotere and Cytoxan every 21 days x 4 cycles. GCSF  injection will be give on Day 3 to prevent neutropenia. Started chemotherapy on 08/25/2021. -- She is here before planned cycle 3-day 1 of docetaxel and cyclophosphamide, tolerating it well except for some grade 1 fatigue. No concerning findings on physical exam.  CBC and CMP reviewed, okay to proceed with chemotherapy as scheduled Return to clinic before planned for cycle 4 of chemotherapy. All questions were answered. The patient knows to call the clinic with any problems, questions or concerns.   I have spent a total of 30 minutes minutes of face-to-face and non-face-to-face time, preparing to see the patient,  performing a medically appropriate examination, counseling and educating the patient, documenting clinical information in the electronic health record, and care coordination.   Benay Pike MD

## 2021-10-08 ENCOUNTER — Inpatient Hospital Stay: Payer: BC Managed Care – PPO

## 2021-10-08 ENCOUNTER — Other Ambulatory Visit: Payer: Self-pay

## 2021-10-08 VITALS — BP 133/76 | HR 62 | Temp 98.2°F | Resp 18

## 2021-10-08 DIAGNOSIS — Z170 Estrogen receptor positive status [ER+]: Secondary | ICD-10-CM

## 2021-10-08 DIAGNOSIS — C50212 Malignant neoplasm of upper-inner quadrant of left female breast: Secondary | ICD-10-CM | POA: Diagnosis not present

## 2021-10-08 MED ORDER — PEGFILGRASTIM-CBQV 6 MG/0.6ML ~~LOC~~ SOSY
6.0000 mg | PREFILLED_SYRINGE | Freq: Once | SUBCUTANEOUS | Status: AC
  Administered 2021-10-08: 6 mg via SUBCUTANEOUS
  Filled 2021-10-08: qty 0.6

## 2021-10-08 NOTE — Patient Instructions (Signed)

## 2021-10-13 ENCOUNTER — Telehealth: Payer: Self-pay | Admitting: *Deleted

## 2021-10-13 NOTE — Telephone Encounter (Signed)
This RN spoke with pt per her VM stating " I started pooping every time I got to pee ".  She states stools are " watery with minimal substance - but I have been eating a lot of fruit too "  She has not taken any medication to alleviate symptoms.  Pt is day 7 of cycle 3 of Taxotere/Cytoxan.  Pt is not nauseated.  This RN advised pt to get Imodium and start with taking 2 tablets upon getting it.  She is to then take 1 tablet after each loose stool to a max of 6 tablets in a 24 hour time.  This RN also advised pt to use a " BRAT" diet until resolution of diarrhea.  Pt verbalized dosing of Imodium back to this RN.  She will also call this RN tomorrow with update on symptoms.

## 2021-10-26 ENCOUNTER — Ambulatory Visit: Payer: BC Managed Care – PPO | Attending: General Surgery

## 2021-10-26 DIAGNOSIS — Z170 Estrogen receptor positive status [ER+]: Secondary | ICD-10-CM | POA: Insufficient documentation

## 2021-10-26 DIAGNOSIS — Z483 Aftercare following surgery for neoplasm: Secondary | ICD-10-CM | POA: Insufficient documentation

## 2021-10-26 DIAGNOSIS — R293 Abnormal posture: Secondary | ICD-10-CM | POA: Insufficient documentation

## 2021-10-26 DIAGNOSIS — C50212 Malignant neoplasm of upper-inner quadrant of left female breast: Secondary | ICD-10-CM | POA: Insufficient documentation

## 2021-10-26 MED FILL — Dexamethasone Sodium Phosphate Inj 100 MG/10ML: INTRAMUSCULAR | Qty: 1 | Status: AC

## 2021-10-27 ENCOUNTER — Encounter: Payer: Self-pay | Admitting: *Deleted

## 2021-10-27 ENCOUNTER — Inpatient Hospital Stay: Payer: BC Managed Care – PPO

## 2021-10-27 ENCOUNTER — Inpatient Hospital Stay: Payer: BC Managed Care – PPO | Attending: Hematology and Oncology

## 2021-10-27 ENCOUNTER — Encounter: Payer: Self-pay | Admitting: Hematology and Oncology

## 2021-10-27 ENCOUNTER — Inpatient Hospital Stay (HOSPITAL_BASED_OUTPATIENT_CLINIC_OR_DEPARTMENT_OTHER): Payer: BC Managed Care – PPO | Admitting: Hematology and Oncology

## 2021-10-27 ENCOUNTER — Other Ambulatory Visit: Payer: Self-pay

## 2021-10-27 VITALS — BP 118/81 | HR 84 | Temp 98.1°F | Resp 16 | Ht 68.0 in | Wt 247.1 lb

## 2021-10-27 DIAGNOSIS — K521 Toxic gastroenteritis and colitis: Secondary | ICD-10-CM | POA: Diagnosis not present

## 2021-10-27 DIAGNOSIS — C50212 Malignant neoplasm of upper-inner quadrant of left female breast: Secondary | ICD-10-CM

## 2021-10-27 DIAGNOSIS — Z170 Estrogen receptor positive status [ER+]: Secondary | ICD-10-CM | POA: Insufficient documentation

## 2021-10-27 DIAGNOSIS — T451X5A Adverse effect of antineoplastic and immunosuppressive drugs, initial encounter: Secondary | ICD-10-CM

## 2021-10-27 DIAGNOSIS — R197 Diarrhea, unspecified: Secondary | ICD-10-CM | POA: Diagnosis not present

## 2021-10-27 LAB — CBC WITH DIFFERENTIAL (CANCER CENTER ONLY)
Abs Immature Granulocytes: 0.08 10*3/uL — ABNORMAL HIGH (ref 0.00–0.07)
Basophils Absolute: 0.1 10*3/uL (ref 0.0–0.1)
Basophils Relative: 1 %
Eosinophils Absolute: 0 10*3/uL (ref 0.0–0.5)
Eosinophils Relative: 0 %
HCT: 28.4 % — ABNORMAL LOW (ref 36.0–46.0)
Hemoglobin: 9 g/dL — ABNORMAL LOW (ref 12.0–15.0)
Immature Granulocytes: 1 %
Lymphocytes Relative: 31 %
Lymphs Abs: 2.8 10*3/uL (ref 0.7–4.0)
MCH: 31.6 pg (ref 26.0–34.0)
MCHC: 31.7 g/dL (ref 30.0–36.0)
MCV: 99.6 fL (ref 80.0–100.0)
Monocytes Absolute: 0.9 10*3/uL (ref 0.1–1.0)
Monocytes Relative: 9 %
Neutro Abs: 5.2 10*3/uL (ref 1.7–7.7)
Neutrophils Relative %: 58 %
Platelet Count: 418 10*3/uL — ABNORMAL HIGH (ref 150–400)
RBC: 2.85 MIL/uL — ABNORMAL LOW (ref 3.87–5.11)
RDW: 14.6 % (ref 11.5–15.5)
Smear Review: NORMAL
WBC Count: 9 10*3/uL (ref 4.0–10.5)
nRBC: 0 % (ref 0.0–0.2)

## 2021-10-27 LAB — CMP (CANCER CENTER ONLY)
ALT: 12 U/L (ref 0–44)
AST: 12 U/L — ABNORMAL LOW (ref 15–41)
Albumin: 3.6 g/dL (ref 3.5–5.0)
Alkaline Phosphatase: 46 U/L (ref 38–126)
Anion gap: 5 (ref 5–15)
BUN: 12 mg/dL (ref 6–20)
CO2: 29 mmol/L (ref 22–32)
Calcium: 9.7 mg/dL (ref 8.9–10.3)
Chloride: 107 mmol/L (ref 98–111)
Creatinine: 1.05 mg/dL — ABNORMAL HIGH (ref 0.44–1.00)
GFR, Estimated: >60 mL/min (ref >60)
Glucose, Bld: 112 mg/dL — ABNORMAL HIGH (ref 70–99)
Potassium: 4.6 mmol/L (ref 3.5–5.1)
Sodium: 141 mmol/L (ref 135–145)
Total Bilirubin: 0.2 mg/dL — ABNORMAL LOW (ref 0.3–1.2)
Total Protein: 6.4 g/dL — ABNORMAL LOW (ref 6.5–8.1)

## 2021-10-27 NOTE — Addendum Note (Signed)
Addended by: Tora Kindred on: 10/27/2021 04:55 PM   Modules accepted: Orders

## 2021-10-27 NOTE — Progress Notes (Signed)
[place] PROGRESS NOTE  Patient Care Team: Hoyt Koch, MD as PCP - General (Internal Medicine) Stark Klein, MD as Consulting Physician (General Surgery) Benay Pike, MD as Consulting Physician (Hematology and Oncology) Kyung Rudd, MD as Consulting Physician (Radiation Oncology) Mauro Kaufmann, RN as Oncology Nurse Navigator Rockwell Germany, RN as Oncology Nurse Navigator  CHIEF COMPLAINTS/PURPOSE OF CONSULTATION:  Breast cancer  SUMMARY OF ONCOLOGIC HISTORY: Oncology History  Malignant neoplasm of upper-inner quadrant of left breast in female, estrogen receptor positive ([place])  06/19/2021 Mammogram   Mammogram from Jun 19, 2021 showed possible asymmetry in the left breast.  Diagnostic mammogram showed suspicious mass in the 10:00 location of the left breast.  Ultrasound of the left axilla negative for lymphadenopathy   07/14/2021 Initial Diagnosis   Malignant neoplasm of upper-inner quadrant of left breast in female, estrogen receptor positive [facility])    Pathology Results   Pathology from the left breast showed invasive ductal carcinoma grade 2 out of 3.  Prognostic showed ER 90% positive strong staining, PR 95% strong staining, Ki-67 of 60% and HER2 equivocal by IHC and FISH is pending   07/21/2021 Definitive Surgery   Left breast lumpectomy showed invasive ductal carcinoma measuring 8 mm, grade 3, focal high-grade DCIS, negative resection margins negative for lymphovascular or perineural invasion, all lymph nodes with no evidence of involvement.    Oncotype testing   Oncotype testing resulted at score of 29, distant risk of recurrence at 9 years of approximately 18%, absolute benefit of chemotherapy greater than 15%   07/21/2021 Oncotype testing   Oncotype score of 29, distant recurrence risk at 9 years was thought to be 18% with antiestrogen therapy alone.  There is demonstrated group average absolute chemotherapy benefit in this arm of greater than  15%.   08/25/2021 -  Chemotherapy   Patient is on Treatment Plan : BREAST TC q21d      Interval History Amanda Ayers presents for follow-up before cycle 4, day 1 of Taxotere and Cytoxan. She had been feeling very poorly. She tells me that the last cycle was miserable.  She had terrible diarrhea, fatigue, pretty much laid in bed for 2 weeks.  She could not even go to the bathroom to pee and poop, she was feeling very weak.  She has finally recovered quite just a bit.  She had an episode of subconjunctival hemorrhage in her left eye which has kind of cleared.  Overall she feels very drained.  She absolutely does not want to move forward with the next cycle of chemotherapy.  She would rather proceed with radiation and return to clinic Rest of the pertinent 10 point ROS reviewed and negative  MEDICAL HISTORY:  Past Medical History:  Diagnosis Date   Arthritis    R knee   Malignant neoplasm of upper-inner quadrant of left breast in female, estrogen receptor positive ([place]) 07/14/2021    SURGICAL HISTORY: Past Surgical History:  Procedure Laterality Date   BREAST LUMPECTOMY WITH RADIOACTIVE SEED AND SENTINEL LYMPH NODE BIOPSY Left 07/21/2021   Procedure: LEFT BREAST LUMPECTOMY WITH RADIOACTIVE SEED AND SENTINEL LYMPH NODE BIOPSY;  Surgeon: Stark Klein, MD;  Location: [facility];  Service: General;  Laterality: Left;    SOCIAL HISTORY: Social History   Socioeconomic History   Marital status: Single    Spouse name: Not on file   Number of children: Not on file   Years of education: Not on file   Highest  education level: Not on file  Occupational History   Not on file  Tobacco Use   Smoking status: Never    Passive exposure: Never   Smokeless tobacco: Never  Substance and Sexual Activity   Alcohol use: Not Currently    Alcohol/week: 1.0 standard drink of alcohol    Types: 1 Cans of beer per week   Drug use: Never   Sexual activity: Yes    Birth control/protection: None   Other Topics Concern   Not on file  Social History Narrative   Not on file   Social Determinants of Health   Financial Resource Strain: Not on file  Food Insecurity: Not on file  Transportation Needs: Not on file  Physical Activity: Not on file  Stress: Not on file  Social Connections: Not on file  Intimate Partner Violence: Not on file    FAMILY HISTORY: Family History  Problem Relation Age of Onset   Multiple myeloma Maternal Uncle        d. 8s   Uterine cancer Maternal Grandmother        dx after age 87    ALLERGIES:  is allergic to prednisone.  MEDICATIONS:  Current Outpatient Medications  Medication Sig Dispense Refill   dexamethasone (DECADRON) 4 MG tablet Take 2 tablets (8 mg total) by mouth 2 (two) times daily. Start the day before Taxotere. Then again the day after chemo for 3 days. 30 tablet 1   HYDROcodone-acetaminophen (NORCO/VICODIN) 5-325 MG tablet Take 1 tablet by mouth every 6 (six) hours as needed for moderate pain. 15 tablet 0   Ibuprofen-diphenhydrAMINE Cit (ADVIL PM PO) Take by mouth at bedtime as needed.     lidocaine-prilocaine (EMLA) cream Apply to affected area once 30 g 3   ondansetron (ZOFRAN) 8 MG tablet Take 1 tablet (8 mg total) by mouth 2 (two) times daily as needed for refractory nausea / vomiting. Start on day 3 after chemo. 30 tablet 1   prochlorperazine (COMPAZINE) 10 MG tablet Take 1 tablet (10 mg total) by mouth every 6 (six) hours as needed (Nausea or vomiting). 30 tablet 1   No current facility-administered medications for this visit.     PHYSICAL EXAMINATION: ECOG PERFORMANCE STATUS: 0 - Asymptomatic  Physical Exam Constitutional:      Appearance: Normal appearance.  Cardiovascular:     Rate and Rhythm: Normal rate and regular rhythm.  Pulmonary:     Effort: Pulmonary effort is normal.     Breath sounds: Normal breath sounds.  Musculoskeletal:        General: No swelling.     Cervical back: Normal range of motion and neck  supple. No rigidity.  Lymphadenopathy:     Cervical: No cervical adenopathy.  Skin:    General: Skin is warm.     Findings: No bruising.  Neurological:     General: No focal deficit present.     Mental Status: She is alert.    LABORATORY DATA:  I have reviewed the data as listed Lab Results  Component Value Date   WBC 9.0 10/27/2021   HGB 9.0 (L) 10/27/2021   HCT 28.4 (L) 10/27/2021   MCV 99.6 10/27/2021   PLT 418 (H) 10/27/2021   Lab Results  Component Value Date   NA 140 10/06/2021   K 4.2 10/06/2021   CL 107 10/06/2021   CO2 28 10/06/2021    RADIOGRAPHIC STUDIES: I have personally reviewed the radiological reports and agreed with the findings in the report.  ASSESSMENT AND PLAN:  Amanda Ayers is a 51 y.o. female who presents to the clinic for continued management for left breast cancer.  #Malignant neoplasm of upper-inner quadrant of left breast --Grade 2 out of 3, ER +90% strong staining PR 95% strong staining, Ki-67 of 60% and HER2 equivocal by IHC and FISH is negative. --S/P  left breast lumpectomy with final pathology showing 8 mm grade 3 IDC, negative margins, no evidence of lymph node involvement. --Oncotype score resulted at 29, distant risk of recurrence at 9 years of 18% and absolute benefit of chemotherapy greater than 15%. --Recommend adjuvant chemotherapy with Taxotere and Cytoxan every 21 days x 4 cycles. GCSF injection will be give on Day 3 to prevent neutropenia. Started chemotherapy on 08/25/2021. -- She is here before planned cycle 4 day 1 of docetaxel and cyclophosphamide. -- After cycle 3, she had terrible grade 2 fatigue, diarrhea, had to lay in bed for almost 2 weeks and she does not want to proceed with cycle 4.  She wants to move forward with adjuvant radiation and then return to clinic for antiestrogen therapy.  I have sent an in basket message to Dr. Lisbeth Renshaw and she will be seeing Dr. Lisbeth Renshaw and Ebony Hail soon. -- I have offered elevating her last  oral dose reduction but she is very reluctant to move forward with chemotherapy at this point. --Chemotherapy induced diarrhea, grade 1 to grade 2, now resolved Subconjunctival hemorrhage likely related to cough and nausea, this has resolved as well. Bilateral lower extremity swelling likely from chemotherapy, encouraged being mobile and leg elevation when resting.  We will continue to monitor this. Return to clinic as scheduled.  I have spent a total of 30 minutes minutes of face-to-face and non-face-to-face time, preparing to see the patient,  performing a medically appropriate examination, counseling and educating the patient, documenting clinical information in the electronic health record, and care coordination.   Benay Pike MD

## 2021-10-28 ENCOUNTER — Telehealth: Payer: Self-pay | Admitting: Radiation Oncology

## 2021-10-28 NOTE — Telephone Encounter (Signed)
Called patient to schedule a follow up visit w. Alison. No answer, LVM for a return call.  

## 2021-10-29 ENCOUNTER — Inpatient Hospital Stay: Payer: BC Managed Care – PPO

## 2021-10-29 ENCOUNTER — Telehealth: Payer: Self-pay | Admitting: Radiation Oncology

## 2021-10-29 NOTE — Telephone Encounter (Signed)
Called patient to schedule a FUN visit w. Amanda Ayers. No answer, LVM for a return call.

## 2021-10-30 ENCOUNTER — Telehealth: Payer: Self-pay | Admitting: Radiation Oncology

## 2021-10-30 NOTE — Telephone Encounter (Signed)
Sent unable to contact letter 9/15.

## 2021-10-30 NOTE — Telephone Encounter (Signed)
Called patient to schedule a follow up visit w. Alison. No answer, LVM for a return call.  

## 2021-11-05 ENCOUNTER — Ambulatory Visit
Admission: RE | Admit: 2021-11-05 | Discharge: 2021-11-05 | Disposition: A | Discharge status: Home / Self Care | Payer: BC Managed Care – PPO | Source: Ambulatory Visit | Attending: Radiation Oncology | Admitting: Radiation Oncology

## 2021-11-05 ENCOUNTER — Encounter: Payer: Self-pay | Admitting: *Deleted

## 2021-11-05 ENCOUNTER — Inpatient Hospital Stay: Payer: BC Managed Care – PPO

## 2021-11-05 ENCOUNTER — Encounter: Payer: Self-pay | Admitting: Radiation Oncology

## 2021-11-05 VITALS — BP 108/73 | HR 82 | Temp 97.8°F | Resp 18 | Ht 68.0 in | Wt 244.3 lb

## 2021-11-05 DIAGNOSIS — Z808 Family history of malignant neoplasm of other organs or systems: Secondary | ICD-10-CM | POA: Insufficient documentation

## 2021-11-05 DIAGNOSIS — C50212 Malignant neoplasm of upper-inner quadrant of left female breast: Secondary | ICD-10-CM | POA: Diagnosis present

## 2021-11-05 DIAGNOSIS — Z170 Estrogen receptor positive status [ER+]: Secondary | ICD-10-CM | POA: Diagnosis not present

## 2021-11-05 DIAGNOSIS — M25461 Effusion, right knee: Secondary | ICD-10-CM | POA: Diagnosis not present

## 2021-11-05 DIAGNOSIS — Z87891 Personal history of nicotine dependence: Secondary | ICD-10-CM | POA: Insufficient documentation

## 2021-11-05 NOTE — Progress Notes (Signed)
LT breast follow-up-new appointment. I verified patient's identity and began nursing interview w/ patient's daughter Ms. Ambria Mayfield in attendance. Patient is doing well. No LT breast discomfort reported at this time.  Meaningful use complete. Ablation- NO chances of pregnancy.  BP 108/73 (BP Location: Right Arm, Patient Position: Sitting, Cuff Size: Large)   Pulse 82   Temp 97.8 F (36.6 C) (Temporal)   Resp 18   Ht '5\' 8"'$  (1.727 m)   Wt 244 lb 4.8 oz (110.8 kg)   SpO2 100%   BMI 37.15 kg/m     SN: Patient is having some RT knee pain 6/10 w/ an upcoming MRI. Patient is a moderate fall risk at the moment.

## 2021-11-06 NOTE — Progress Notes (Signed)
Radiation Oncology         [phone] ________________________________  Name: Amanda Ayers        MRN: [id number]  Date of Service: 11/05/2021 DOB: 1970-12-09  KA:[id number], Real Cons, MD  Benay Pike, MD     REFERRING PHYSICIAN: Benay Pike, MD   DIAGNOSIS: The encounter diagnosis was Malignant neoplasm of upper-inner quadrant of left breast in female, estrogen receptor positive ([place]).   HISTORY OF PRESENT ILLNESS: Amanda Ayers is a 51 y.o. female originally seen in the multidisciplinary breast clinic for a new diagnosis of left breast cancer. The patient was noted to have screening assymmetry in the left breast in the medial central location. By diagnostic imaging there was a mass in the 10:00 position measuring 9 mm. Her left axilla was negative for adenopathy. She underwent biopsy on 07/06/21 that showed a grade 2 invasive ductal carcinoma that was ER/PR positive, HER2 negative with a Ki 67 of 60%.   Since her last visit, the patient underwent a left breast lumpectomy and sentinel lymph node biopsy on 07/21/2021 with Dr. Barry Dienes.  Final pathology showed grade 3 invasive ductal carcinoma measuring 8 mm with focal high-grade DCIS.  Her resection margins were negative.  Fibroadipose tissue was noted in one of the sentinel specimens but 2 nodes were negative for disease.  Oncotype DX score showed a risk of 29 and she began systemic chemotherapy on 08/25/2021.  Her last infusion was on 10/06/2021.  She is seen to discuss adjuvant radiotherapy to the left breast.    PREVIOUS RADIATION THERAPY: No   PAST MEDICAL HISTORY:  Past Medical History:  Diagnosis Date   Arthritis    R knee   Malignant neoplasm of upper-inner quadrant of left breast in female, estrogen receptor positive ([place]) 07/14/2021       PAST SURGICAL HISTORY: Past Surgical History:  Procedure Laterality Date   BREAST LUMPECTOMY WITH RADIOACTIVE SEED AND SENTINEL LYMPH NODE BIOPSY Left 07/21/2021   Procedure:  LEFT BREAST LUMPECTOMY WITH RADIOACTIVE SEED AND SENTINEL LYMPH NODE BIOPSY;  Surgeon: Stark Klein, MD;  Location: [facility];  Service: General;  Laterality: Left;     FAMILY HISTORY:  Family History  Problem Relation Age of Onset   Multiple myeloma Maternal Uncle        d. 11s   Uterine cancer Maternal Grandmother        dx after age 79     SOCIAL HISTORY:  reports that she has never smoked. She has never been exposed to tobacco smoke. She has never used smokeless tobacco. She reports that she does not currently use alcohol after a past usage of about 1.0 standard drink of alcohol per week. She reports that she does not use drugs. The patient is single and lives in [place]. She works at [employer] and is accompanied by her daughter.    ALLERGIES: Prednisone   MEDICATIONS:  Current Outpatient Medications  Medication Sig Dispense Refill   HYDROcodone-acetaminophen (NORCO/VICODIN) 5-325 MG tablet Take 1 tablet by mouth every 6 (six) hours as needed for moderate pain. 15 tablet 0   Ibuprofen-diphenhydrAMINE Cit (ADVIL PM PO) Take by mouth at bedtime as needed.     No current facility-administered medications for this visit.     REVIEW OF SYSTEMS: On review of systems, the patient reports that she is doing really well after both surgery and chemotherapy. She reports that she was very tired during that time. She's also been in much pain from swelling  in her right leg at the level of her knee. She states that she is hoping to be worked in sooner to hear the results of her MRI she had performed. No other complaints are noted.      PHYSICAL EXAM:  Wt Readings from Last 3 Encounters:  10/27/21 247 lb 1.6 oz (112.1 kg)  10/06/21 244 lb 12.8 oz (111 kg)  09/15/21 243 lb (110.2 kg)   Temp Readings from Last 3 Encounters:  10/27/21 98.1 F (36.7 C) (Temporal)  10/08/21 98.2 F (36.8 C) (Oral)  10/06/21 97.9 F (36.6 C) (Temporal)   BP Readings from Last 3  Encounters:  10/27/21 118/81  10/08/21 133/76  10/06/21 115/67   Pulse Readings from Last 3 Encounters:  10/27/21 84  10/08/21 62  10/06/21 64    In general this is a well appearing African American female in no acute distress. She's alert and oriented x4 and appropriate throughout the examination. Cardiopulmonary assessment is negative for acute distress and she exhibits normal effort.  Her left breast incision is well-healed.  Similar changes are noted of her axilla.  No erythema, separation, or drainage was noted of either site. Her breasts however are large, pendulous, and the bulk of her breast tissue occupies the lowest aspect of her tissue and this is located over the level of her mid abdomen. She has a brace over her right knee that is not disturbed.    ECOG = 0  0 - Asymptomatic (Fully active, able to carry on all predisease activities without restriction)  1 - Symptomatic but completely ambulatory (Restricted in physically strenuous activity but ambulatory and able to carry out work of a light or sedentary nature. For example, light housework, office work)  2 - Symptomatic, <50% in bed during the day (Ambulatory and capable of all self care but unable to carry out any work activities. Up and about more than 50% of waking hours)  3 - Symptomatic, >50% in bed, but not bedbound (Capable of only limited self-care, confined to bed or chair 50% or more of waking hours)  4 - Bedbound (Completely disabled. Cannot carry on any self-care. Totally confined to bed or chair)  5 - Death   Eustace Pen MM, Creech RH, Tormey DC, et al. [phone]). "Toxicity and response criteria of the [facility]". [place] Oncol. 5 (6): 649-55    LABORATORY DATA:  Lab Results  Component Value Date   WBC 9.0 10/27/2021   HGB 9.0 (L) 10/27/2021   HCT 28.4 (L) 10/27/2021   MCV 99.6 10/27/2021   PLT 418 (H) 10/27/2021   Lab Results  Component Value Date   NA 141 10/27/2021   K 4.6  10/27/2021   CL 107 10/27/2021   CO2 29 10/27/2021   Lab Results  Component Value Date   ALT 12 10/27/2021   AST 12 (L) 10/27/2021   ALKPHOS 46 10/27/2021   BILITOT 0.2 (L) 10/27/2021      RADIOGRAPHY: No results found.     IMPRESSION/PLAN: 1. Stage IA, [id number], grade 3, ER/PR positive invasive ductal carcinoma of the left breast. Dr. Lisbeth Renshaw has reviewed her final pathology.  Today I reviewed her case as well as the nature of left breast disease.  She has done well since surgery and has completed systemic chemotherapy.  She would benefit from external radiotherapy to the breast  to reduce risks of local recurrence followed by antiestrogen therapy. We discussed the risks, benefits, short, and long term effects of  radiotherapy, as well as the curative intent, and the patient is interested in proceeding.  I reviewed the delivery and logistics of radiotherapy and that Dr. Lisbeth Renshaw recommends 4 weeks of radiotherapy to the left breast. Written consent is obtained and placed in the chart, a copy was provided to the patient. The patient will be contacted to coordinate treatment planning by our simulation department.  We discussed the rationale to consider prone positioning due to her breast size and shape, and she is motivated for this to also reduce exposure to the heart.  2. Right knee pain and swelling. We will  touch base with orthopedics so we can ask if she has any risks related to her recent treatment and if she can be seen any sooner.   In a visit lasting 45 minutes, greater than 50% of the time was spent face to face reviewing her case, as well as in preparation of, discussing, and coordinating the patient's care.      Carola Rhine, [facility]    **Disclaimer: This note was dictated with voice recognition software. Similar sounding words can inadvertently be transcribed and this note may contain transcription errors which may not have been corrected upon publication of note.**

## 2021-11-09 ENCOUNTER — Telehealth: Payer: Self-pay

## 2021-11-09 NOTE — Telephone Encounter (Signed)
Patient is calling in stating that she is needing clearance for surgery, and Dr.Bean sent the information over to [place].

## 2021-11-10 ENCOUNTER — Encounter: Payer: Self-pay | Admitting: *Deleted

## 2021-11-10 NOTE — Telephone Encounter (Signed)
Notified pt --Form is placed to Dr. Sharlet Salina box, waiting for signature.

## 2021-11-11 ENCOUNTER — Ambulatory Visit: Payer: BC Managed Care – PPO | Admitting: Radiation Oncology

## 2021-11-11 ENCOUNTER — Encounter: Payer: Self-pay | Admitting: *Deleted

## 2021-11-16 ENCOUNTER — Encounter: Payer: Self-pay | Admitting: Internal Medicine

## 2021-11-17 NOTE — Telephone Encounter (Signed)
Contacted the patient and was able to schedule an office visit for her concern.

## 2021-11-19 ENCOUNTER — Telehealth: Payer: Self-pay | Admitting: *Deleted

## 2021-11-19 ENCOUNTER — Encounter: Payer: Self-pay | Admitting: Internal Medicine

## 2021-11-19 ENCOUNTER — Ambulatory Visit: Payer: BC Managed Care – PPO | Admitting: Internal Medicine

## 2021-11-19 DIAGNOSIS — Z0000 Encounter for general adult medical examination without abnormal findings: Secondary | ICD-10-CM

## 2021-11-19 DIAGNOSIS — E669 Obesity, unspecified: Secondary | ICD-10-CM | POA: Diagnosis not present

## 2021-11-19 DIAGNOSIS — C50212 Malignant neoplasm of upper-inner quadrant of left female breast: Secondary | ICD-10-CM

## 2021-11-19 DIAGNOSIS — Z170 Estrogen receptor positive status [ER+]: Secondary | ICD-10-CM

## 2021-11-19 NOTE — Telephone Encounter (Signed)
Clearance form surgery (right knee scope) [phone].

## 2021-11-19 NOTE — Assessment & Plan Note (Signed)
Overall stable.   

## 2021-11-19 NOTE — Assessment & Plan Note (Addendum)
Flu shot declines. Covid-19 counseled. Shingrix declines. Tetanus declines. Colonoscopy we will wait until after current cancer treatment. Mammogram up to date, pap smear up to date. Counseled about sun safety and mole surveillance. Counseled about the dangers of distracted driving. Given 10 year screening recommendations. EKG done today and normal.

## 2021-11-19 NOTE — Progress Notes (Signed)
   Subjective:   Patient ID: Amanda Ayers, female    DOB: [birth year], 51 y.o.   MRN: [id number]  HPI The patient is a 51 YO female coming in for pre-op clearance. Torn tendon and needs surgery. Also wants physical today.  Review of Systems  Constitutional: Negative.   HENT: Negative.    Eyes: Negative.   Respiratory:  Negative for cough, chest tightness and shortness of breath.   Cardiovascular:  Negative for chest pain, palpitations and leg swelling.  Gastrointestinal:  Negative for abdominal distention, abdominal pain, constipation, diarrhea, nausea and vomiting.  Musculoskeletal:  Positive for arthralgias.  Skin: Negative.   Neurological: Negative.   Psychiatric/Behavioral: Negative.      Objective:  Physical Exam Constitutional:      Appearance: She is well-developed.  HENT:     Head: Normocephalic and atraumatic.  Cardiovascular:     Rate and Rhythm: Normal rate and regular rhythm.  Pulmonary:     Effort: Pulmonary effort is normal. No respiratory distress.     Breath sounds: Normal breath sounds. No wheezing or rales.  Abdominal:     General: Bowel sounds are normal. There is no distension.     Palpations: Abdomen is soft.     Tenderness: There is no abdominal tenderness. There is no rebound.  Musculoskeletal:        General: Tenderness present.     Cervical back: Normal range of motion.  Skin:    General: Skin is warm and dry.  Neurological:     Mental Status: She is alert and oriented to person, place, and time.     Coordination: Coordination normal.     Vitals:   11/19/21 0813  BP: 128/64  Pulse: 92  Temp: 98.6 F (37 C)  SpO2: 99%  Weight: 244 lb (110.7 kg)  Height: '5\' 9"'$  (1.753 m)   EKG: Rate 87, axis normal, interval normal, sinus, no st or t wave changes, no prior to compare   Assessment & Plan:

## 2021-11-19 NOTE — Assessment & Plan Note (Signed)
Will defer radiation until after knee surgery which is fine.

## 2021-11-26 ENCOUNTER — Encounter: Payer: Self-pay | Admitting: *Deleted

## 2021-12-02 ENCOUNTER — Ambulatory Visit: Payer: BC Managed Care – PPO | Admitting: Internal Medicine

## 2021-12-04 ENCOUNTER — Encounter: Payer: Self-pay | Admitting: *Deleted

## 2021-12-14 ENCOUNTER — Encounter: Payer: Self-pay | Admitting: *Deleted

## 2021-12-14 ENCOUNTER — Telehealth: Payer: Self-pay | Admitting: *Deleted

## 2021-12-14 NOTE — Telephone Encounter (Signed)
Spoke with patient to follow up. She had her knee surgery about 2 weeks ago and states she's doing well. She states she needs to get back in and see D.r Lisbeth Renshaw. Informed her I would send a message and they will call her with an appt. Patient verbalized understanding.

## 2021-12-23 ENCOUNTER — Encounter: Payer: Self-pay | Admitting: *Deleted

## 2021-12-28 ENCOUNTER — Other Ambulatory Visit: Payer: Self-pay

## 2021-12-28 ENCOUNTER — Ambulatory Visit
Admission: RE | Admit: 2021-12-28 | Discharge: 2021-12-28 | Disposition: A | Discharge status: Home / Self Care | Payer: BC Managed Care – PPO | Source: Ambulatory Visit | Attending: Radiation Oncology | Admitting: Radiation Oncology

## 2021-12-28 DIAGNOSIS — Z510 Encounter for antineoplastic radiation therapy: Secondary | ICD-10-CM | POA: Diagnosis present

## 2021-12-28 DIAGNOSIS — Z170 Estrogen receptor positive status [ER+]: Secondary | ICD-10-CM | POA: Insufficient documentation

## 2021-12-28 DIAGNOSIS — C50212 Malignant neoplasm of upper-inner quadrant of left female breast: Secondary | ICD-10-CM | POA: Diagnosis not present

## 2022-01-01 ENCOUNTER — Encounter: Payer: Self-pay | Admitting: *Deleted

## 2022-01-03 DIAGNOSIS — Z510 Encounter for antineoplastic radiation therapy: Secondary | ICD-10-CM | POA: Diagnosis not present

## 2022-01-04 ENCOUNTER — Ambulatory Visit: Payer: BC Managed Care – PPO | Admitting: Radiation Oncology

## 2022-01-05 ENCOUNTER — Ambulatory Visit: Payer: BC Managed Care – PPO

## 2022-01-06 ENCOUNTER — Ambulatory Visit: Payer: BC Managed Care – PPO

## 2022-01-11 ENCOUNTER — Ambulatory Visit
Admission: RE | Admit: 2022-01-11 | Discharge: 2022-01-11 | Disposition: A | Discharge status: Home / Self Care | Payer: BC Managed Care – PPO | Source: Ambulatory Visit | Attending: Radiation Oncology | Admitting: Radiation Oncology

## 2022-01-11 ENCOUNTER — Other Ambulatory Visit: Payer: Self-pay

## 2022-01-11 DIAGNOSIS — Z510 Encounter for antineoplastic radiation therapy: Secondary | ICD-10-CM | POA: Diagnosis not present

## 2022-01-11 LAB — RAD ONC ARIA SESSION SUMMARY
Course Elapsed Days: 0
Plan Fractions Treated to Date: 1
Plan Prescribed Dose Per Fraction: 2.66 Gy
Plan Total Fractions Prescribed: 16
Plan Total Prescribed Dose: 42.56 Gy
Reference Point Dosage Given to Date: 2.66 Gy
Reference Point Session Dosage Given: 2.66 Gy
Session Number: 1

## 2022-01-12 ENCOUNTER — Ambulatory Visit
Admission: RE | Admit: 2022-01-12 | Discharge: 2022-01-12 | Disposition: A | Discharge status: Home / Self Care | Payer: BC Managed Care – PPO | Source: Ambulatory Visit | Attending: Radiation Oncology | Admitting: Radiation Oncology

## 2022-01-12 ENCOUNTER — Other Ambulatory Visit: Payer: Self-pay

## 2022-01-12 ENCOUNTER — Ambulatory Visit: Payer: BC Managed Care – PPO | Admitting: Internal Medicine

## 2022-01-12 DIAGNOSIS — Z510 Encounter for antineoplastic radiation therapy: Secondary | ICD-10-CM | POA: Diagnosis not present

## 2022-01-12 LAB — RAD ONC ARIA SESSION SUMMARY
Course Elapsed Days: 1
Plan Fractions Treated to Date: 2
Plan Prescribed Dose Per Fraction: 2.66 Gy
Plan Total Fractions Prescribed: 16
Plan Total Prescribed Dose: 42.56 Gy
Reference Point Dosage Given to Date: 5.32 Gy
Reference Point Session Dosage Given: 2.66 Gy
Session Number: 2

## 2022-01-13 ENCOUNTER — Other Ambulatory Visit: Payer: Self-pay

## 2022-01-13 ENCOUNTER — Ambulatory Visit
Admission: RE | Admit: 2022-01-13 | Discharge: 2022-01-13 | Disposition: A | Discharge status: Home / Self Care | Payer: BC Managed Care – PPO | Source: Ambulatory Visit | Attending: Radiation Oncology | Admitting: Radiation Oncology

## 2022-01-13 DIAGNOSIS — Z510 Encounter for antineoplastic radiation therapy: Secondary | ICD-10-CM | POA: Diagnosis not present

## 2022-01-13 LAB — RAD ONC ARIA SESSION SUMMARY
Course Elapsed Days: 2
Plan Fractions Treated to Date: 3
Plan Prescribed Dose Per Fraction: 2.66 Gy
Plan Total Fractions Prescribed: 16
Plan Total Prescribed Dose: 42.56 Gy
Reference Point Dosage Given to Date: 7.98 Gy
Reference Point Session Dosage Given: 2.66 Gy
Session Number: 3

## 2022-01-14 ENCOUNTER — Ambulatory Visit
Admission: RE | Admit: 2022-01-14 | Discharge: 2022-01-14 | Disposition: A | Discharge status: Home / Self Care | Payer: BC Managed Care – PPO | Source: Ambulatory Visit | Attending: Radiation Oncology | Admitting: Radiation Oncology

## 2022-01-14 ENCOUNTER — Other Ambulatory Visit: Payer: Self-pay

## 2022-01-14 DIAGNOSIS — Z510 Encounter for antineoplastic radiation therapy: Secondary | ICD-10-CM | POA: Diagnosis not present

## 2022-01-14 LAB — RAD ONC ARIA SESSION SUMMARY
Course Elapsed Days: 3
Plan Fractions Treated to Date: 4
Plan Prescribed Dose Per Fraction: 2.66 Gy
Plan Total Fractions Prescribed: 16
Plan Total Prescribed Dose: 42.56 Gy
Reference Point Dosage Given to Date: 10.64 Gy
Reference Point Session Dosage Given: 2.66 Gy
Session Number: 4

## 2022-01-15 ENCOUNTER — Ambulatory Visit
Admission: RE | Admit: 2022-01-15 | Discharge: 2022-01-15 | Disposition: A | Discharge status: Home / Self Care | Payer: BC Managed Care – PPO | Source: Ambulatory Visit | Attending: Radiation Oncology | Admitting: Radiation Oncology

## 2022-01-15 ENCOUNTER — Other Ambulatory Visit: Payer: Self-pay

## 2022-01-15 DIAGNOSIS — C50212 Malignant neoplasm of upper-inner quadrant of left female breast: Secondary | ICD-10-CM | POA: Diagnosis present

## 2022-01-15 DIAGNOSIS — Z170 Estrogen receptor positive status [ER+]: Secondary | ICD-10-CM | POA: Diagnosis present

## 2022-01-15 LAB — RAD ONC ARIA SESSION SUMMARY
Course Elapsed Days: 4
Plan Fractions Treated to Date: 5
Plan Prescribed Dose Per Fraction: 2.66 Gy
Plan Total Fractions Prescribed: 16
Plan Total Prescribed Dose: 42.56 Gy
Reference Point Dosage Given to Date: 13.3 Gy
Reference Point Session Dosage Given: 2.66 Gy
Session Number: 5

## 2022-01-15 MED ORDER — ALRA NON-METALLIC DEODORANT (RAD-ONC)
1.0000 | Freq: Once | TOPICAL | Status: AC
  Administered 2022-01-15: 1 via TOPICAL

## 2022-01-15 MED ORDER — RADIAPLEXRX EX GEL: Freq: Once | CUTANEOUS | Status: AC

## 2022-01-18 ENCOUNTER — Other Ambulatory Visit: Payer: Self-pay

## 2022-01-18 ENCOUNTER — Ambulatory Visit
Admission: RE | Admit: 2022-01-18 | Discharge: 2022-01-18 | Disposition: A | Discharge status: Home / Self Care | Payer: BC Managed Care – PPO | Source: Ambulatory Visit | Attending: Radiation Oncology | Admitting: Radiation Oncology

## 2022-01-18 DIAGNOSIS — C50212 Malignant neoplasm of upper-inner quadrant of left female breast: Secondary | ICD-10-CM | POA: Diagnosis not present

## 2022-01-18 LAB — RAD ONC ARIA SESSION SUMMARY
Course Elapsed Days: 7
Plan Fractions Treated to Date: 6
Plan Prescribed Dose Per Fraction: 2.66 Gy
Plan Total Fractions Prescribed: 16
Plan Total Prescribed Dose: 42.56 Gy
Reference Point Dosage Given to Date: 15.96 Gy
Reference Point Session Dosage Given: 2.66 Gy
Session Number: 6

## 2022-01-19 ENCOUNTER — Other Ambulatory Visit: Payer: Self-pay

## 2022-01-19 ENCOUNTER — Ambulatory Visit
Admission: RE | Admit: 2022-01-19 | Discharge: 2022-01-19 | Disposition: A | Discharge status: Home / Self Care | Payer: BC Managed Care – PPO | Source: Ambulatory Visit | Attending: Radiation Oncology | Admitting: Radiation Oncology

## 2022-01-19 DIAGNOSIS — C50212 Malignant neoplasm of upper-inner quadrant of left female breast: Secondary | ICD-10-CM | POA: Diagnosis not present

## 2022-01-19 LAB — RAD ONC ARIA SESSION SUMMARY
Course Elapsed Days: 8
Plan Fractions Treated to Date: 7
Plan Prescribed Dose Per Fraction: 2.66 Gy
Plan Total Fractions Prescribed: 16
Plan Total Prescribed Dose: 42.56 Gy
Reference Point Dosage Given to Date: 18.62 Gy
Reference Point Session Dosage Given: 1.3433 Gy
Session Number: 7

## 2022-01-20 ENCOUNTER — Other Ambulatory Visit: Payer: Self-pay

## 2022-01-20 ENCOUNTER — Ambulatory Visit
Admission: RE | Admit: 2022-01-20 | Discharge: 2022-01-20 | Disposition: A | Discharge status: Home / Self Care | Payer: BC Managed Care – PPO | Source: Ambulatory Visit | Attending: Radiation Oncology | Admitting: Radiation Oncology

## 2022-01-20 DIAGNOSIS — C50212 Malignant neoplasm of upper-inner quadrant of left female breast: Secondary | ICD-10-CM | POA: Diagnosis not present

## 2022-01-20 LAB — RAD ONC ARIA SESSION SUMMARY
Course Elapsed Days: 9
Plan Fractions Treated to Date: 8
Plan Prescribed Dose Per Fraction: 2.66 Gy
Plan Total Fractions Prescribed: 16
Plan Total Prescribed Dose: 42.56 Gy
Reference Point Dosage Given to Date: 21.28 Gy
Reference Point Session Dosage Given: 2.66 Gy
Session Number: 8

## 2022-01-21 ENCOUNTER — Other Ambulatory Visit: Payer: Self-pay

## 2022-01-21 ENCOUNTER — Ambulatory Visit
Admission: RE | Admit: 2022-01-21 | Discharge: 2022-01-21 | Disposition: A | Discharge status: Home / Self Care | Payer: BC Managed Care – PPO | Source: Ambulatory Visit | Attending: Radiation Oncology | Admitting: Radiation Oncology

## 2022-01-21 DIAGNOSIS — C50212 Malignant neoplasm of upper-inner quadrant of left female breast: Secondary | ICD-10-CM | POA: Diagnosis not present

## 2022-01-21 LAB — RAD ONC ARIA SESSION SUMMARY
Course Elapsed Days: 10
Plan Fractions Treated to Date: 9
Plan Prescribed Dose Per Fraction: 2.66 Gy
Plan Total Fractions Prescribed: 16
Plan Total Prescribed Dose: 42.56 Gy
Reference Point Dosage Given to Date: 23.94 Gy
Reference Point Session Dosage Given: 2.66 Gy
Session Number: 9

## 2022-01-22 ENCOUNTER — Other Ambulatory Visit: Payer: Self-pay

## 2022-01-22 ENCOUNTER — Ambulatory Visit
Admission: RE | Admit: 2022-01-22 | Discharge: 2022-01-22 | Disposition: A | Discharge status: Home / Self Care | Payer: BC Managed Care – PPO | Source: Ambulatory Visit | Attending: Radiation Oncology | Admitting: Radiation Oncology

## 2022-01-22 DIAGNOSIS — C50212 Malignant neoplasm of upper-inner quadrant of left female breast: Secondary | ICD-10-CM | POA: Diagnosis not present

## 2022-01-22 LAB — RAD ONC ARIA SESSION SUMMARY
Course Elapsed Days: 11
Plan Fractions Treated to Date: 10
Plan Prescribed Dose Per Fraction: 2.66 Gy
Plan Total Fractions Prescribed: 16
Plan Total Prescribed Dose: 42.56 Gy
Reference Point Dosage Given to Date: 26.6 Gy
Reference Point Session Dosage Given: 2.66 Gy
Session Number: 10

## 2022-01-25 ENCOUNTER — Inpatient Hospital Stay (HOSPITAL_BASED_OUTPATIENT_CLINIC_OR_DEPARTMENT_OTHER): Payer: BC Managed Care – PPO | Admitting: Hematology and Oncology

## 2022-01-25 ENCOUNTER — Other Ambulatory Visit: Payer: Self-pay

## 2022-01-25 ENCOUNTER — Encounter: Payer: Self-pay | Admitting: Hematology and Oncology

## 2022-01-25 ENCOUNTER — Ambulatory Visit
Admission: RE | Admit: 2022-01-25 | Discharge: 2022-01-25 | Disposition: A | Discharge status: Home / Self Care | Payer: BC Managed Care – PPO | Source: Ambulatory Visit | Attending: Radiation Oncology | Admitting: Radiation Oncology

## 2022-01-25 VITALS — BP 124/77 | HR 74 | Temp 97.9°F | Resp 16 | Ht 69.0 in | Wt 246.0 lb

## 2022-01-25 DIAGNOSIS — Z9221 Personal history of antineoplastic chemotherapy: Secondary | ICD-10-CM | POA: Insufficient documentation

## 2022-01-25 DIAGNOSIS — Z170 Estrogen receptor positive status [ER+]: Secondary | ICD-10-CM

## 2022-01-25 DIAGNOSIS — C50212 Malignant neoplasm of upper-inner quadrant of left female breast: Secondary | ICD-10-CM

## 2022-01-25 LAB — RAD ONC ARIA SESSION SUMMARY
Course Elapsed Days: 14
Plan Fractions Treated to Date: 11
Plan Prescribed Dose Per Fraction: 2.66 Gy
Plan Total Fractions Prescribed: 16
Plan Total Prescribed Dose: 42.56 Gy
Reference Point Dosage Given to Date: 29.26 Gy
Reference Point Session Dosage Given: 2.66 Gy
Session Number: 11

## 2022-01-25 NOTE — Progress Notes (Signed)
Patient Amanda Ayers PROGRESS NOTE  Patient Care Team: Hoyt Koch, MD as PCP - General (Internal Medicine) Stark Klein, MD as Consulting Physician (General Surgery) Benay Pike, MD as Consulting Physician (Hematology and Oncology) Kyung Rudd, MD as Consulting Physician (Radiation Oncology) Mauro Kaufmann, RN as Oncology Nurse Navigator Rockwell Germany, RN as Oncology Nurse Navigator  CHIEF COMPLAINTS/PURPOSE OF CONSULTATION:  Breast cancer  SUMMARY OF ONCOLOGIC HISTORY: Oncology History  Malignant neoplasm of upper-inner quadrant of left breast in female, estrogen receptor positive ([place])  06/19/2021 Mammogram   Mammogram from Jun 19, 2021 showed possible asymmetry in the left breast.  Diagnostic mammogram showed suspicious mass in the 10:00 location of the left breast.  Ultrasound of the left axilla negative for lymphadenopathy   07/14/2021 Initial Diagnosis   Malignant neoplasm of upper-inner quadrant of left breast in female, estrogen receptor positive [facility])    Pathology Results   Pathology from the left breast showed invasive ductal carcinoma grade 2 out of 3.  Prognostic showed ER 90% positive strong staining, PR 95% strong staining, Ki-67 of 60% and HER2 equivocal by IHC and FISH is pending   07/21/2021 Definitive Surgery   Left breast lumpectomy showed invasive ductal carcinoma measuring 8 mm, grade 3, focal high-grade DCIS, negative resection margins negative for lymphovascular or perineural invasion, all lymph nodes with no evidence of involvement.    Oncotype testing   Oncotype testing resulted at score of 29, distant risk of recurrence at 9 years of approximately 18%, absolute benefit of chemotherapy greater than 15%   07/21/2021 Oncotype testing   Oncotype score of 29, distant recurrence risk at 9 years was thought to be 18% with antiestrogen therapy alone.  There is demonstrated group average absolute chemotherapy benefit in this arm of greater  than 15%.   08/25/2021 - 10/08/2021 Chemotherapy   Patient is on Treatment Plan : BREAST TC q21d      Interval History  Amanda Ayers presents for follow-up. Last chemo completed on 10/06/2021 She is now undergoing adjuvant radiation, has 9 more days. She has now recovered from chemotherapy adverse effects. She had a torn meniscus in the right knee, had surgery 10/16 Rest of the pertinent 10 point ROS reviewed and negative  MEDICAL HISTORY:  Past Medical History:  Diagnosis Date   Arthritis    R knee   Malignant neoplasm of upper-inner quadrant of left breast in female, estrogen receptor positive ([place]) 07/14/2021    SURGICAL HISTORY: Past Surgical History:  Procedure Laterality Date   BREAST LUMPECTOMY WITH RADIOACTIVE SEED AND SENTINEL LYMPH NODE BIOPSY Left 07/21/2021   Procedure: LEFT BREAST LUMPECTOMY WITH RADIOACTIVE SEED AND SENTINEL LYMPH NODE BIOPSY;  Surgeon: Stark Klein, MD;  Location: [place];  Service: General;  Laterality: Left;    SOCIAL HISTORY: Social History   Socioeconomic History   Marital status: Single    Spouse name: Not on file   Number of children: Not on file   Years of education: Not on file   Highest education level: Not on file  Occupational History   Not on file  Tobacco Use   Smoking status: Never    Passive exposure: Never   Smokeless tobacco: Never  Substance and Sexual Activity   Alcohol use: Not Currently    Alcohol/week: 1.0 standard drink of alcohol    Types: 1 Cans of beer per week   Drug use: Never   Sexual activity: Yes    Birth control/protection: None  Other Topics Concern   Not on file  Social History Narrative   Not on file   Social Determinants of Health   Financial Resource Strain: Not on file  Food Insecurity: Not on file  Transportation Needs: Not on file  Physical Activity: Not on file  Stress: Not on file  Social Connections: Not on file  Intimate Partner Violence: Not on file    FAMILY  HISTORY: Family History  Problem Relation Age of Onset   Multiple myeloma Maternal Uncle        d. 56s   Uterine cancer Maternal Grandmother        dx after age 22    ALLERGIES:  is allergic to prednisone.  MEDICATIONS:  Current Outpatient Medications  Medication Sig Dispense Refill   HYDROcodone-acetaminophen (NORCO/VICODIN) 5-325 MG tablet Take 1 tablet by mouth every 6 (six) hours as needed for moderate pain. 15 tablet 0   Ibuprofen-diphenhydrAMINE Cit (ADVIL PM PO) Take by mouth at bedtime as needed.     No current facility-administered medications for this visit.     PHYSICAL EXAMINATION: ECOG PERFORMANCE STATUS: 0 - Asymptomatic  Physical Exam Constitutional:      Appearance: Normal appearance.  Chest:     Comments: Left breast with some skin darkening Skin:    General: Skin is warm.     Findings: No bruising.  Neurological:     Mental Status: She is alert.    LABORATORY DATA:  I have reviewed the data as listed Lab Results  Component Value Date   WBC 9.0 10/27/2021   HGB 9.0 (L) 10/27/2021   HCT 28.4 (L) 10/27/2021   MCV 99.6 10/27/2021   PLT 418 (H) 10/27/2021   Lab Results  Component Value Date   NA 141 10/27/2021   K 4.6 10/27/2021   CL 107 10/27/2021   CO2 29 10/27/2021    RADIOGRAPHIC STUDIES: I have personally reviewed the radiological reports and agreed with the findings in the report.  ASSESSMENT AND PLAN:  Amanda Ayers is a 51 y.o. female who presents to the clinic for continued management for left breast cancer.  #Malignant neoplasm of upper-inner quadrant of left breast --Grade 2 out of 3, ER +90% strong staining PR 95% strong staining, Ki-67 of 60% and HER2 equivocal by IHC and FISH is negative. --S/P  left breast lumpectomy with final pathology showing 8 mm grade 3 IDC, negative margins, no evidence of lymph node involvement. --Oncotype score resulted at 29, distant risk of recurrence at 9 years of 18% and absolute benefit of  chemotherapy greater than 15%. --Recommend adjuvant chemotherapy with Taxotere and Cytoxan every 21 days x 4-- After cycle 3, she had terrible grade 2 fatigue, diarrhea, had to lay in bed for almost 2 weeks and she does not want to proceed with cycle 4.   --she is now on adj radiation, almost done, has 9 more days left. -- Today we have discussed about considering adjuvant antiestrogen therapy with ovarian suppression.  We have discussed about 3 choices including ovarian suppression with aromatase inhibitors versus ovarian suppression with tamoxifen versus tamoxifen.  She understands that in the order of efficacy ovarian suppression with aromatase inhibitors may be the most effective although she may have more postmenopausal symptoms with this combination.  Labs printed information about tamoxifen, aromatase inhibitors as well as Zoladex today.  Also gone over the mechanism of action of both the class of drugs, adverse effects with each class.  She will complete radiation in  about 10 days, will take a 2-week holiday and will return to clinic in January to initiate antiestrogen therapy.  In the meantime she would like to think about her options.  Return to clinic as scheduled.  I have spent a total of 30 minutes minutes of face-to-face and non-face-to-face time, preparing to see the patient,  performing a medically appropriate examination, counseling and educating the patient, documenting clinical information in the electronic health record, and care coordination.   Benay Pike MD

## 2022-01-26 ENCOUNTER — Ambulatory Visit
Admission: RE | Admit: 2022-01-26 | Discharge: 2022-01-26 | Disposition: A | Discharge status: Home / Self Care | Payer: BC Managed Care – PPO | Source: Ambulatory Visit | Attending: Radiation Oncology | Admitting: Radiation Oncology

## 2022-01-26 ENCOUNTER — Other Ambulatory Visit: Payer: Self-pay

## 2022-01-26 DIAGNOSIS — C50212 Malignant neoplasm of upper-inner quadrant of left female breast: Secondary | ICD-10-CM | POA: Diagnosis not present

## 2022-01-26 LAB — RAD ONC ARIA SESSION SUMMARY
Course Elapsed Days: 15
Plan Fractions Treated to Date: 12
Plan Prescribed Dose Per Fraction: 2.66 Gy
Plan Total Fractions Prescribed: 16
Plan Total Prescribed Dose: 42.56 Gy
Reference Point Dosage Given to Date: 31.92 Gy
Reference Point Session Dosage Given: 2.66 Gy
Session Number: 12

## 2022-01-27 ENCOUNTER — Ambulatory Visit
Admission: RE | Admit: 2022-01-27 | Discharge: 2022-01-27 | Disposition: A | Discharge status: Home / Self Care | Payer: BC Managed Care – PPO | Source: Ambulatory Visit | Attending: Radiation Oncology | Admitting: Radiation Oncology

## 2022-01-27 ENCOUNTER — Other Ambulatory Visit: Payer: Self-pay

## 2022-01-27 DIAGNOSIS — C50212 Malignant neoplasm of upper-inner quadrant of left female breast: Secondary | ICD-10-CM | POA: Diagnosis not present

## 2022-01-27 LAB — RAD ONC ARIA SESSION SUMMARY
Course Elapsed Days: 16
Plan Fractions Treated to Date: 13
Plan Prescribed Dose Per Fraction: 2.66 Gy
Plan Total Fractions Prescribed: 16
Plan Total Prescribed Dose: 42.56 Gy
Reference Point Dosage Given to Date: 34.58 Gy
Reference Point Session Dosage Given: 2.66 Gy
Session Number: 13

## 2022-01-28 ENCOUNTER — Ambulatory Visit
Admission: RE | Admit: 2022-01-28 | Discharge: 2022-01-28 | Disposition: A | Discharge status: Home / Self Care | Payer: BC Managed Care – PPO | Source: Ambulatory Visit | Attending: Radiation Oncology | Admitting: Radiation Oncology

## 2022-01-28 ENCOUNTER — Ambulatory Visit: Payer: BC Managed Care – PPO

## 2022-01-28 ENCOUNTER — Other Ambulatory Visit: Payer: Self-pay

## 2022-01-28 DIAGNOSIS — C50212 Malignant neoplasm of upper-inner quadrant of left female breast: Secondary | ICD-10-CM | POA: Diagnosis not present

## 2022-01-28 LAB — RAD ONC ARIA SESSION SUMMARY
Course Elapsed Days: 17
Plan Fractions Treated to Date: 14
Plan Prescribed Dose Per Fraction: 2.66 Gy
Plan Total Fractions Prescribed: 16
Plan Total Prescribed Dose: 42.56 Gy
Reference Point Dosage Given to Date: 37.24 Gy
Reference Point Session Dosage Given: 2.66 Gy
Session Number: 14

## 2022-01-29 ENCOUNTER — Other Ambulatory Visit: Payer: Self-pay

## 2022-01-29 ENCOUNTER — Ambulatory Visit: Payer: BC Managed Care – PPO

## 2022-01-29 ENCOUNTER — Ambulatory Visit
Admission: RE | Admit: 2022-01-29 | Discharge: 2022-01-29 | Disposition: A | Discharge status: Home / Self Care | Payer: BC Managed Care – PPO | Source: Ambulatory Visit | Attending: Radiation Oncology | Admitting: Radiation Oncology

## 2022-01-29 DIAGNOSIS — C50212 Malignant neoplasm of upper-inner quadrant of left female breast: Secondary | ICD-10-CM | POA: Diagnosis not present

## 2022-01-29 LAB — RAD ONC ARIA SESSION SUMMARY
Course Elapsed Days: 18
Plan Fractions Treated to Date: 15
Plan Prescribed Dose Per Fraction: 2.66 Gy
Plan Total Fractions Prescribed: 16
Plan Total Prescribed Dose: 42.56 Gy
Reference Point Dosage Given to Date: 39.9 Gy
Reference Point Session Dosage Given: 2.66 Gy
Session Number: 15

## 2022-01-29 MED ORDER — RADIAPLEXRX EX GEL: Freq: Once | CUTANEOUS | Status: AC

## 2022-02-01 ENCOUNTER — Other Ambulatory Visit: Payer: Self-pay

## 2022-02-01 ENCOUNTER — Ambulatory Visit
Admission: RE | Admit: 2022-02-01 | Discharge: 2022-02-01 | Disposition: A | Discharge status: Home / Self Care | Payer: BC Managed Care – PPO | Source: Ambulatory Visit | Attending: Radiation Oncology | Admitting: Radiation Oncology

## 2022-02-01 ENCOUNTER — Ambulatory Visit: Payer: BC Managed Care – PPO

## 2022-02-01 DIAGNOSIS — C50212 Malignant neoplasm of upper-inner quadrant of left female breast: Secondary | ICD-10-CM | POA: Diagnosis not present

## 2022-02-01 LAB — RAD ONC ARIA SESSION SUMMARY
Course Elapsed Days: 21
Plan Fractions Treated to Date: 16
Plan Prescribed Dose Per Fraction: 2.66 Gy
Plan Total Fractions Prescribed: 16
Plan Total Prescribed Dose: 42.56 Gy
Reference Point Dosage Given to Date: 42.56 Gy
Reference Point Session Dosage Given: 2.66 Gy
Session Number: 16

## 2022-02-01 NOTE — Progress Notes (Signed)
                                                                                                                                                             Patient Name: Amanda Ayers MRN: [id number] DOB: [birth year] Referring Physician: Benay Pike Date of Service: 02/05/2022 [facility], [place]                                                        End Of Treatment Note  Diagnoses: C50.212-Malignant neoplasm of upper-inner quadrant of left female breast  Cancer Staging: Stage IA, pT1bN0M0, grade 3, ER/PR positive invasive ductal carcinoma of the left breast   Intent: Curative  Radiation Treatment Dates: 01/11/2022 through 02/05/2022 Site Technique Total Dose (Gy) Dose per Fx (Gy) Completed Fx Beam Energies  Breast, Left: Breast_L 3D 42.56/42.56 2.66 16/16 6X  Breast, Left: Breast_L_Bst 3D 8/8 2 4/4 6X, 10X   Narrative: The patient tolerated radiation therapy relatively well. She developed fatigue and anticipated skin changes in the treatment field.     Plan: The patient will receive a call in about one month from the radiation oncology department. She will continue follow up with Dr. Chryl Heck as well.  ________________________________________________    Carola Rhine, [facility]

## 2022-02-02 ENCOUNTER — Ambulatory Visit: Payer: BC Managed Care – PPO

## 2022-02-02 ENCOUNTER — Ambulatory Visit
Admission: RE | Admit: 2022-02-02 | Discharge: 2022-02-02 | Disposition: A | Discharge status: Home / Self Care | Payer: BC Managed Care – PPO | Source: Ambulatory Visit | Attending: Radiation Oncology | Admitting: Radiation Oncology

## 2022-02-02 ENCOUNTER — Other Ambulatory Visit: Payer: Self-pay

## 2022-02-02 DIAGNOSIS — C50212 Malignant neoplasm of upper-inner quadrant of left female breast: Secondary | ICD-10-CM | POA: Diagnosis not present

## 2022-02-02 LAB — RAD ONC ARIA SESSION SUMMARY
Course Elapsed Days: 22
Plan Fractions Treated to Date: 1
Plan Prescribed Dose Per Fraction: 2 Gy
Plan Total Fractions Prescribed: 4
Plan Total Prescribed Dose: 8 Gy
Reference Point Dosage Given to Date: 2 Gy
Reference Point Session Dosage Given: 2 Gy
Session Number: 17

## 2022-02-03 ENCOUNTER — Ambulatory Visit: Payer: BC Managed Care – PPO

## 2022-02-03 ENCOUNTER — Other Ambulatory Visit: Payer: Self-pay

## 2022-02-03 ENCOUNTER — Ambulatory Visit
Admission: RE | Admit: 2022-02-03 | Discharge: 2022-02-03 | Disposition: A | Discharge status: Home / Self Care | Payer: BC Managed Care – PPO | Source: Ambulatory Visit | Attending: Radiation Oncology | Admitting: Radiation Oncology

## 2022-02-03 DIAGNOSIS — C50212 Malignant neoplasm of upper-inner quadrant of left female breast: Secondary | ICD-10-CM | POA: Diagnosis not present

## 2022-02-03 LAB — RAD ONC ARIA SESSION SUMMARY
Course Elapsed Days: 23
Plan Fractions Treated to Date: 2
Plan Prescribed Dose Per Fraction: 2 Gy
Plan Total Fractions Prescribed: 4
Plan Total Prescribed Dose: 8 Gy
Reference Point Dosage Given to Date: 4 Gy
Reference Point Session Dosage Given: 2 Gy
Session Number: 18

## 2022-02-04 ENCOUNTER — Other Ambulatory Visit: Payer: Self-pay

## 2022-02-04 ENCOUNTER — Ambulatory Visit
Admission: RE | Admit: 2022-02-04 | Discharge: 2022-02-04 | Disposition: A | Discharge status: Home / Self Care | Payer: BC Managed Care – PPO | Source: Ambulatory Visit | Attending: Radiation Oncology | Admitting: Radiation Oncology

## 2022-02-04 DIAGNOSIS — C50212 Malignant neoplasm of upper-inner quadrant of left female breast: Secondary | ICD-10-CM | POA: Diagnosis not present

## 2022-02-04 LAB — RAD ONC ARIA SESSION SUMMARY
Course Elapsed Days: 24
Plan Fractions Treated to Date: 3
Plan Prescribed Dose Per Fraction: 2 Gy
Plan Total Fractions Prescribed: 4
Plan Total Prescribed Dose: 8 Gy
Reference Point Dosage Given to Date: 6 Gy
Reference Point Session Dosage Given: 2 Gy
Session Number: 19

## 2022-02-05 ENCOUNTER — Ambulatory Visit
Admission: RE | Admit: 2022-02-05 | Discharge: 2022-02-05 | Disposition: A | Discharge status: Home / Self Care | Payer: BC Managed Care – PPO | Source: Ambulatory Visit | Attending: Radiation Oncology | Admitting: Radiation Oncology

## 2022-02-05 ENCOUNTER — Other Ambulatory Visit: Payer: Self-pay

## 2022-02-05 ENCOUNTER — Encounter: Payer: Self-pay | Admitting: Radiation Oncology

## 2022-02-05 DIAGNOSIS — Z170 Estrogen receptor positive status [ER+]: Secondary | ICD-10-CM

## 2022-02-05 DIAGNOSIS — C50212 Malignant neoplasm of upper-inner quadrant of left female breast: Secondary | ICD-10-CM | POA: Diagnosis not present

## 2022-02-05 LAB — RAD ONC ARIA SESSION SUMMARY
Course Elapsed Days: 25
Plan Fractions Treated to Date: 4
Plan Prescribed Dose Per Fraction: 2 Gy
Plan Total Fractions Prescribed: 4
Plan Total Prescribed Dose: 8 Gy
Reference Point Dosage Given to Date: 8 Gy
Reference Point Session Dosage Given: 2 Gy
Session Number: 20

## 2022-02-05 MED ORDER — RADIAPLEXRX EX GEL: Freq: Once | CUTANEOUS | Status: AC

## 2022-02-10 ENCOUNTER — Encounter: Payer: Self-pay | Admitting: *Deleted

## 2022-02-10 DIAGNOSIS — Z170 Estrogen receptor positive status [ER+]: Secondary | ICD-10-CM

## 2022-02-22 ENCOUNTER — Inpatient Hospital Stay: Payer: BC Managed Care – PPO | Attending: Hematology and Oncology | Admitting: Hematology and Oncology

## 2022-02-22 DIAGNOSIS — C50212 Malignant neoplasm of upper-inner quadrant of left female breast: Secondary | ICD-10-CM | POA: Insufficient documentation

## 2022-02-22 DIAGNOSIS — Z170 Estrogen receptor positive status [ER+]: Secondary | ICD-10-CM | POA: Insufficient documentation

## 2022-02-25 ENCOUNTER — Telehealth: Payer: Self-pay | Admitting: Hematology and Oncology

## 2022-02-25 NOTE — Telephone Encounter (Signed)
Contacted patient to scheduled appointments. Left message with appointment details and a call back number if patient had any questions or could not accommodate the time we provided.   

## 2022-03-08 ENCOUNTER — Inpatient Hospital Stay: Payer: BC Managed Care – PPO | Admitting: Hematology and Oncology

## 2022-03-09 ENCOUNTER — Encounter: Payer: Self-pay | Admitting: Hematology and Oncology

## 2022-03-09 ENCOUNTER — Inpatient Hospital Stay: Payer: BC Managed Care – PPO | Admitting: Hematology and Oncology

## 2022-03-09 ENCOUNTER — Inpatient Hospital Stay (HOSPITAL_BASED_OUTPATIENT_CLINIC_OR_DEPARTMENT_OTHER): Payer: BC Managed Care – PPO | Admitting: Hematology and Oncology

## 2022-03-09 DIAGNOSIS — C50212 Malignant neoplasm of upper-inner quadrant of left female breast: Secondary | ICD-10-CM

## 2022-03-09 DIAGNOSIS — Z170 Estrogen receptor positive status [ER+]: Secondary | ICD-10-CM | POA: Diagnosis not present

## 2022-03-09 MED ORDER — ANASTROZOLE 1 MG PO TABS: 1.0000 mg | ORAL_TABLET | Freq: Every day | ORAL | 3 refills | Status: DC

## 2022-03-09 NOTE — Progress Notes (Signed)
[facility] Follow up:    Hoyt Koch, MD [place] [address]   DIAGNOSIS:  Cancer Staging  Malignant neoplasm of upper-inner quadrant of left breast in female, estrogen receptor positive ([place]) Staging form: Breast, AJCC 8th Edition - Clinical stage from 07/15/2021: Stage IA (cT1b, cN0, cM0, G2, ER+, PR+, HER2-) - Unsigned Stage prefix: Initial diagnosis Method of lymph node assessment: Clinical Histologic grading system: 3 grade system   SUMMARY OF ONCOLOGIC HISTORY: Oncology History  Malignant neoplasm of upper-inner quadrant of left breast in female, estrogen receptor positive ([place])  06/19/2021 Mammogram   Mammogram from Jun 19, 2021 showed possible asymmetry in the left breast.  Diagnostic mammogram showed suspicious mass in the 10:00 location of the left breast.  Ultrasound of the left axilla negative for lymphadenopathy   07/14/2021 Initial Diagnosis   Malignant neoplasm of upper-inner quadrant of left breast in female, estrogen receptor positive [facility])    Pathology Results   Pathology from the left breast showed invasive ductal carcinoma grade 2 out of 3.  Prognostic showed ER 90% positive strong staining, PR 95% strong staining, Ki-67 of 60% and HER2 equivocal by IHC and FISH is pending   07/21/2021 Definitive Surgery   Left breast lumpectomy showed invasive ductal carcinoma measuring 8 mm, grade 3, focal high-grade DCIS, negative resection margins negative for lymphovascular or perineural invasion, all lymph nodes with no evidence of involvement.    Oncotype testing   Oncotype testing resulted at score of 29, distant risk of recurrence at 9 years of approximately 18%, absolute benefit of chemotherapy greater than 15%   07/21/2021 Oncotype testing   Oncotype score of 29, distant recurrence risk at 9 years was thought to be 18% with antiestrogen therapy alone.  There is demonstrated group average absolute chemotherapy benefit in this  arm of greater than 15%.   08/25/2021 - 10/08/2021 Chemotherapy   Patient is on Treatment Plan : BREAST TC q21d     01/11/2022 - 02/05/2022 Radiation Therapy   Completed adjuvant radiation.     CURRENT THERAPY:   INTERVAL HISTORY: Amanda Ayers 52 y.o. female returns for telephone follow up. Since last visit, she completed adjuvant radiation from November 20 09/03/2021 to 02/05/2022  Patient Active Problem List   Diagnosis Date Noted   Malignant neoplasm of upper-inner quadrant of left breast in female, estrogen receptor positive ([place]) 07/14/2021   Obesity (BMI 35.0-39.9 without comorbidity) 06/05/2021   Routine general medical examination at a health care facility 06/05/2021    is allergic to prednisone.  MEDICAL HISTORY: Past Medical History:  Diagnosis Date   Arthritis    R knee   Malignant neoplasm of upper-inner quadrant of left breast in female, estrogen receptor positive ([place]) 07/14/2021    SURGICAL HISTORY: Past Surgical History:  Procedure Laterality Date   BREAST LUMPECTOMY WITH RADIOACTIVE SEED AND SENTINEL LYMPH NODE BIOPSY Left 07/21/2021   Procedure: LEFT BREAST LUMPECTOMY WITH RADIOACTIVE SEED AND SENTINEL LYMPH NODE BIOPSY;  Surgeon: Stark Klein, MD;  Location: [place];  Service: General;  Laterality: Left;    SOCIAL HISTORY: Social History   Socioeconomic History   Marital status: Single    Spouse name: Not on file   Number of children: Not on file   Years of education: Not on file   Highest education level: Not on file  Occupational History   Not on file  Tobacco Use   Smoking status: Never    Passive  exposure: Never   Smokeless tobacco: Never  Substance and Sexual Activity   Alcohol use: Not Currently    Alcohol/week: 1.0 standard drink of alcohol    Types: 1 Cans of beer per week   Drug use: Never   Sexual activity: Yes    Birth control/protection: None  Other Topics Concern   Not on file  Social History Narrative    Not on file   Social Determinants of Health   Financial Resource Strain: Not on file  Food Insecurity: Not on file  Transportation Needs: Not on file  Physical Activity: Not on file  Stress: Not on file  Social Connections: Not on file  Intimate Partner Violence: Not on file    FAMILY HISTORY: Family History  Problem Relation Age of Onset   Multiple myeloma Maternal Uncle        d. 54s   Uterine cancer Maternal Grandmother        dx after age 22    Review of Systems - Oncology    PHYSICAL EXAMINATION  ECOG PERFORMANCE STATUS: 0 - Asymptomatic  There were no vitals filed for this visit.  PE deferred, telephone visit.  LABORATORY DATA:  CBC    Component Value Date/Time   WBC 9.0 10/27/2021 0857   RBC 2.85 (L) 10/27/2021 0857   HGB 9.0 (L) 10/27/2021 0857   HCT 28.4 (L) 10/27/2021 0857   PLT 418 (H) 10/27/2021 0857   MCV 99.6 10/27/2021 0857   MCH 31.6 10/27/2021 0857   MCHC 31.7 10/27/2021 0857   RDW 14.6 10/27/2021 0857   LYMPHSABS 2.8 10/27/2021 0857   MONOABS 0.9 10/27/2021 0857   EOSABS 0.0 10/27/2021 0857   BASOSABS 0.1 10/27/2021 0857    CMP     Component Value Date/Time   NA 141 10/27/2021 0857   K 4.6 10/27/2021 0857   CL 107 10/27/2021 0857   CO2 29 10/27/2021 0857   GLUCOSE 112 (H) 10/27/2021 0857   BUN 12 10/27/2021 0857   CREATININE 1.05 (H) 10/27/2021 0857   CALCIUM 9.7 10/27/2021 0857   PROT 6.4 (L) 10/27/2021 0857   ALBUMIN 3.6 10/27/2021 0857   AST 12 (L) 10/27/2021 0857   ALT 12 10/27/2021 0857   ALKPHOS 46 10/27/2021 0857   BILITOT 0.2 (L) 10/27/2021 0857   GFRNONAA >60 10/27/2021 0857    PENDING LABS:   RADIOGRAPHIC STUDIES:  No results found.   PATHOLOGY:   ASSESSMENT and THERAPY PLAN:   Malignant neoplasm of upper-inner quadrant of left breast in female, estrogen receptor positive ([place]) This is a very pleasant 52 year old postmenopausal female patient with no significant past medical history with newly diagnosed  left breast invasive ductal carcinoma, grade 2 out of 3, ER +90% strong staining PR 95% strong staining, Ki-67 of 60% and HER2 equivocal by IHC and FISH is negative. She is status post left breast lumpectomy with final pathology showing 8 mm grade 3 IDC, negative margins, no evidence of lymph node involvement.    Oncotype score resulted at 29, distant risk of recurrence at 9 years of 18% and absolute benefit of chemotherapy greater than 15%.  We have discussed about adjuvant docetaxel and cyclophosphamide every 21 days for 4 cycles.  She completed 3/4 planned cycles of TC. She is now s/p adjuvant radiation. We have discussed today about anti estrogen therapy.   Her follow-up appointments and infusions are currently scheduled.   No orders of the defined types were placed in this encounter.   All questions  were answered. The patient knows to call the clinic with any problems, questions or concerns. We can certainly see the patient much sooner if necessary.   Benay Pike, MD 03/09/2022

## 2022-03-09 NOTE — Progress Notes (Signed)
[facility] Follow up:    Hoyt Koch, MD [place] [address]   DIAGNOSIS:  Cancer Staging  Malignant neoplasm of upper-inner quadrant of left breast in female, estrogen receptor positive ([place]) Staging form: Breast, AJCC 8th Edition - Clinical stage from 07/15/2021: Stage IA (cT1b, cN0, cM0, G2, ER+, PR+, HER2-) - Unsigned Stage prefix: Initial diagnosis Method of lymph node assessment: Clinical Histologic grading system: 3 grade system   SUMMARY OF ONCOLOGIC HISTORY: Oncology History  Malignant neoplasm of upper-inner quadrant of left breast in female, estrogen receptor positive ([place])  06/19/2021 Mammogram   Mammogram from Jun 19, 2021 showed possible asymmetry in the left breast.  Diagnostic mammogram showed suspicious mass in the 10:00 location of the left breast.  Ultrasound of the left axilla negative for lymphadenopathy   07/14/2021 Initial Diagnosis   Malignant neoplasm of upper-inner quadrant of left breast in female, estrogen receptor positive [facility])    Pathology Results   Pathology from the left breast showed invasive ductal carcinoma grade 2 out of 3.  Prognostic showed ER 90% positive strong staining, PR 95% strong staining, Ki-67 of 60% and HER2 equivocal by IHC and FISH is pending   07/21/2021 Definitive Surgery   Left breast lumpectomy showed invasive ductal carcinoma measuring 8 mm, grade 3, focal high-grade DCIS, negative resection margins negative for lymphovascular or perineural invasion, all lymph nodes with no evidence of involvement.    Oncotype testing   Oncotype testing resulted at score of 29, distant risk of recurrence at 9 years of approximately 18%, absolute benefit of chemotherapy greater than 15%   07/21/2021 Oncotype testing   Oncotype score of 29, distant recurrence risk at 9 years was thought to be 18% with antiestrogen therapy alone.  There is demonstrated group average absolute chemotherapy benefit in this  arm of greater than 15%.   08/25/2021 - 10/08/2021 Chemotherapy   Patient is on Treatment Plan : BREAST TC q21d     01/11/2022 - 02/05/2022 Radiation Therapy   Completed adjuvant radiation.     CURRENT THERAPY:  INTERVAL HISTORY:  [name] 52 y.o. female returns for follow up.  She is healing well from radiation. She denies any complaints except for weight gain. She wants to do the best option available for anti estrogen therapy. Rest of the pertinent 10 point ROS reviewed and negative.   Patient Active Problem List   Diagnosis Date Noted   Malignant neoplasm of upper-inner quadrant of left breast in female, estrogen receptor positive ([place]) 07/14/2021   Obesity (BMI 35.0-39.9 without comorbidity) 06/05/2021   Routine general medical examination at a health care facility 06/05/2021    is allergic to prednisone.  MEDICAL HISTORY: Past Medical History:  Diagnosis Date   Arthritis    R knee   Malignant neoplasm of upper-inner quadrant of left breast in female, estrogen receptor positive ([place]) 07/14/2021   Routine general medical examination at a health care facility 06/05/2021    SURGICAL HISTORY: Past Surgical History:  Procedure Laterality Date   BREAST LUMPECTOMY WITH RADIOACTIVE SEED AND SENTINEL LYMPH NODE BIOPSY Left 07/21/2021   Procedure: LEFT BREAST LUMPECTOMY WITH RADIOACTIVE SEED AND SENTINEL LYMPH NODE BIOPSY;  Surgeon: Stark Klein, MD;  Location: [facility];  Service: General;  Laterality: Left;    SOCIAL HISTORY: Social History   Socioeconomic History   Marital status: Single    Spouse name: Not on file   Number of children: Not on file  Years of education: Not on file   Highest education level: Not on file  Occupational History   Not on file  Tobacco Use   Smoking status: Never    Passive exposure: Never   Smokeless tobacco: Never  Substance and Sexual Activity   Alcohol use: Not Currently    Alcohol/week: 1.0 standard drink  of alcohol    Types: 1 Cans of beer per week   Drug use: Never   Sexual activity: Yes    Birth control/protection: None  Other Topics Concern   Not on file  Social History Narrative   Not on file   Social Determinants of Health   Financial Resource Strain: Not on file  Food Insecurity: Not on file  Transportation Needs: Not on file  Physical Activity: Not on file  Stress: Not on file  Social Connections: Not on file  Intimate Partner Violence: Not on file    FAMILY HISTORY: Family History  Problem Relation Age of Onset   Multiple myeloma Maternal Uncle        d. 74s   Uterine cancer Maternal Grandmother        dx after age 11    PHYSICAL EXAMINATION  ECOG PERFORMANCE STATUS: 0 - Asymptomatic  VS and PE not done, telephone visit.   LABORATORY DATA:  CBC    Component Value Date/Time   WBC 9.0 10/27/2021 0857   RBC 2.85 (L) 10/27/2021 0857   HGB 9.0 (L) 10/27/2021 0857   HCT 28.4 (L) 10/27/2021 0857   PLT 418 (H) 10/27/2021 0857   MCV 99.6 10/27/2021 0857   MCH 31.6 10/27/2021 0857   MCHC 31.7 10/27/2021 0857   RDW 14.6 10/27/2021 0857   LYMPHSABS 2.8 10/27/2021 0857   MONOABS 0.9 10/27/2021 0857   EOSABS 0.0 10/27/2021 0857   BASOSABS 0.1 10/27/2021 0857    CMP     Component Value Date/Time   NA 141 10/27/2021 0857   K 4.6 10/27/2021 0857   CL 107 10/27/2021 0857   CO2 29 10/27/2021 0857   GLUCOSE 112 (H) 10/27/2021 0857   BUN 12 10/27/2021 0857   CREATININE 1.05 (H) 10/27/2021 0857   CALCIUM 9.7 10/27/2021 0857   PROT 6.4 (L) 10/27/2021 0857   ALBUMIN 3.6 10/27/2021 0857   AST 12 (L) 10/27/2021 0857   ALT 12 10/27/2021 0857   ALKPHOS 46 10/27/2021 0857   BILITOT 0.2 (L) 10/27/2021 0857   GFRNONAA >60 10/27/2021 0857   RADIOGRAPHIC STUDIES:  No results found.  ASSESSMENT and THERAPY PLAN:   Malignant neoplasm of upper-inner quadrant of left breast in female, estrogen receptor positive ([place]) This is a very pleasant 52 year old  postmenopausal female patient with no significant past medical history with newly diagnosed left breast invasive ductal carcinoma, grade 2 out of 3, ER +90% strong staining PR 95% strong staining, Ki-67 of 60% and HER2 equivocal by IHC and FISH is negative. She is status post left breast lumpectomy with final pathology showing 8 mm grade 3 IDC, negative margins, no evidence of lymph node involvement.    Oncotype score resulted at 29, distant risk of recurrence at 9 years of 18% and absolute benefit of chemotherapy greater than 15%.  We have discussed about adjuvant docetaxel and cyclophosphamide every 21 days for 4 cycles.  She completed 3/4 planned cycles of TC. She is now s/p adjuvant radiation. We have discussed today about anti estrogen therapy. She would like to proceed with OFS and Anastrozole.  With regards to weight gain,  offered appointment in the weight loss clinic, she refused. Scheduling message sent.  RTC in one month.   No orders of the defined types were placed in this encounter.   All questions were answered. The patient knows to call the clinic with any problems, questions or concerns. We can certainly see the patient much sooner if necessary. This note was electronically signed. Benay Pike, MD 03/09/2022

## 2022-03-09 NOTE — Assessment & Plan Note (Signed)
This is a very pleasant 52 year old postmenopausal female patient with no significant past medical history with newly diagnosed left breast invasive ductal carcinoma, grade 2 out of 3, ER +90% strong staining PR 95% strong staining, Ki-67 of 60% and HER2 equivocal by IHC and FISH is negative. She is status post left breast lumpectomy with final pathology showing 8 mm grade 3 IDC, negative margins, no evidence of lymph node involvement.    Oncotype score resulted at 29, distant risk of recurrence at 9 years of 18% and absolute benefit of chemotherapy greater than 15%.  We have discussed about adjuvant docetaxel and cyclophosphamide every 21 days for 4 cycles.  She completed 3/4 planned cycles of TC. She is now s/p adjuvant radiation. We have discussed today about anti estrogen therapy. She would like to proceed with OFS and Anastrozole.  With regards to weight gain, offered appointment in the weight loss clinic, she refused. Scheduling message sent.  RTC in one month.

## 2022-03-09 NOTE — Assessment & Plan Note (Signed)
This is a very pleasant 52 year old postmenopausal female patient with no significant past medical history with newly diagnosed left breast invasive ductal carcinoma, grade 2 out of 3, ER +90% strong staining PR 95% strong staining, Ki-67 of 60% and HER2 equivocal by IHC and FISH is negative. She is status post left breast lumpectomy with final pathology showing 8 mm grade 3 IDC, negative margins, no evidence of lymph node involvement.    Oncotype score resulted at 29, distant risk of recurrence at 9 years of 18% and absolute benefit of chemotherapy greater than 15%.  We have discussed about adjuvant docetaxel and cyclophosphamide every 21 days for 4 cycles.  She completed 3/4 planned cycles of TC. She is now s/p adjuvant radiation. We have discussed today about anti estrogen therapy.   Her follow-up appointments and infusions are currently scheduled.

## 2022-03-10 ENCOUNTER — Telehealth: Payer: BC Managed Care – PPO | Admitting: Hematology and Oncology

## 2022-03-16 ENCOUNTER — Encounter: Payer: Self-pay | Admitting: Hematology and Oncology

## 2022-03-17 ENCOUNTER — Telehealth: Payer: Self-pay | Admitting: Hematology and Oncology

## 2022-03-17 ENCOUNTER — Inpatient Hospital Stay: Payer: BC Managed Care – PPO

## 2022-03-17 NOTE — Telephone Encounter (Signed)
Contacted patient to scheduled appointments. Patient is aware of appointments that are scheduled.   

## 2022-03-18 ENCOUNTER — Inpatient Hospital Stay: Payer: BC Managed Care – PPO | Attending: Hematology and Oncology

## 2022-03-18 VITALS — BP 124/77 | HR 72 | Temp 98.2°F | Resp 18

## 2022-03-18 DIAGNOSIS — Z170 Estrogen receptor positive status [ER+]: Secondary | ICD-10-CM | POA: Insufficient documentation

## 2022-03-18 DIAGNOSIS — Z5111 Encounter for antineoplastic chemotherapy: Secondary | ICD-10-CM | POA: Diagnosis present

## 2022-03-18 DIAGNOSIS — R21 Rash and other nonspecific skin eruption: Secondary | ICD-10-CM | POA: Diagnosis not present

## 2022-03-18 DIAGNOSIS — C50212 Malignant neoplasm of upper-inner quadrant of left female breast: Secondary | ICD-10-CM | POA: Insufficient documentation

## 2022-03-18 MED ORDER — GOSERELIN ACETATE 3.6 MG ~~LOC~~ IMPL
3.6000 mg | DRUG_IMPLANT | Freq: Once | SUBCUTANEOUS | Status: AC
  Administered 2022-03-18: 3.6 mg via SUBCUTANEOUS
  Filled 2022-03-18: qty 3.6

## 2022-03-18 NOTE — Patient Instructions (Signed)
Goserelin Implant What is this medication? GOSERELIN (GOE se rel in) treats prostate cancer and breast cancer. It works by decreasing levels of the hormones testosterone and estrogen in the body. This prevents prostate and breast cancer cells from spreading or growing. It may also be used to treat endometriosis. This is a condition where the tissue that lines the uterus grows outside the uterus. It works by decreasing the amount of estrogen your body makes, which reduces heavy bleeding and pain. It can also be used to help thin the lining of the uterus before a surgery used to prevent or reduce heavy periods. This medicine may be used for other purposes; ask your health care provider or pharmacist if you have questions. COMMON BRAND NAME(S): Zoladex, Zoladex 3-Month What should I tell my care team before I take this medication? They need to know if you have any of these conditions: Bone problems Diabetes Heart disease History of irregular heartbeat or rhythm An unusual or allergic reaction to goserelin, other medications, foods, dyes, or preservatives Pregnant or trying to get pregnant Breastfeeding How should I use this medication? This medication is injected under the skin. It is given by your care team in a hospital or clinic setting. Talk to your care team about the use of this medication in children. Special care may be needed. Overdosage: If you think you have taken too much of this medicine contact a poison control center or emergency room at once. NOTE: This medicine is only for you. Do not share this medicine with others. What if I miss a dose? Keep appointments for follow-up doses. It is important not to miss your dose. Call your care team if you are unable to keep an appointment. What may interact with this medication? Do not take this medication with any of the following: Cisapride Dronedarone Pimozide Thioridazine This medication may also interact with the following: Other  medications that cause heart rhythm changes This list may not describe all possible interactions. Give your health care provider a list of all the medicines, herbs, non-prescription drugs, or dietary supplements you use. Also tell them if you smoke, drink alcohol, or use illegal drugs. Some items may interact with your medicine. What should I watch for while using this medication? Visit your care team for regular checks on your progress. Your symptoms may appear to get worse during the first weeks of this therapy. Tell your care team if your symptoms do not start to get better or if they get worse after this time. Using this medication for a long time may weaken your bones. If you smoke or frequently drink alcohol you may increase your risk of bone loss. A family history of osteoporosis, chronic use of medications for seizures (convulsions), or corticosteroids can also increase your risk of bone loss. The risk of bone fractures may be increased. Talk to your care team about your bone health. This medication may increase blood sugar. The risk may be higher in patients who already have diabetes. Ask your care team what you can do to lower your risk of diabetes while taking this medication. This medication should stop regular monthly menstruation in women. Tell your care team if you continue to menstruate. Talk to your care team if you wish to become pregnant or think you might be pregnant. This medication can cause serious birth defects if taken during pregnancy or for 12 weeks after stopping treatment. Talk to your care team about reliable forms of contraception. Do not breastfeed while taking this   medication. This medication may cause infertility. Talk to your care team if you are concerned about your fertility. What side effects may I notice from receiving this medication? Side effects that you should report to your care team as soon as possible: Allergic reactions--skin rash, itching, hives, swelling  of the face, lips, tongue, or throat Change in the amount of urine Heart attack--pain or tightness in the chest, shoulders, arms, or jaw, nausea, shortness of breath, cold or clammy skin, feeling faint or lightheaded Heart rhythm changes--fast or irregular heartbeat, dizziness, feeling faint or lightheaded, chest pain, trouble breathing High blood sugar (hyperglycemia)--increased thirst or amount of urine, unusual weakness or fatigue, blurry vision High calcium level--increased thirst or amount of urine, nausea, vomiting, confusion, unusual weakness or fatigue, bone pain Pain, redness, irritation, or bruising at the injection site Severe back pain, numbness or weakness of the hands, arms, legs, or feet, loss of coordination, loss of bowel or bladder control Stroke--sudden numbness or weakness of the face, arm, or leg, trouble speaking, confusion, trouble walking, loss of balance or coordination, dizziness, severe headache, change in vision Swelling and pain of the tumor site or lymph nodes Trouble passing urine Side effects that usually do not require medical attention (report to your care team if they continue or are bothersome): Change in sex drive or performance Headache Hot flashes Rapid or extreme change in emotion or mood Sweating Swelling of the ankles, hands, or feet Unusual vaginal discharge, itching, or odor This list may not describe all possible side effects. Call your doctor for medical advice about side effects. You may report side effects to FDA at 1-800-FDA-1088. Where should I keep my medication? This medication is given in a hospital or clinic. It will not be stored at home. NOTE: This sheet is a summary. It may not cover all possible information. If you have questions about this medicine, talk to your doctor, pharmacist, or health care provider.  2023 Elsevier/Gold Standard (2007-03-25 00:00:00)  

## 2022-03-19 ENCOUNTER — Ambulatory Visit: Payer: BC Managed Care – PPO | Admitting: Internal Medicine

## 2022-03-22 ENCOUNTER — Ambulatory Visit
Admission: RE | Admit: 2022-03-22 | Discharge: 2022-03-22 | Disposition: A | Discharge status: Home / Self Care | Payer: BC Managed Care – PPO | Source: Ambulatory Visit | Attending: Hematology and Oncology | Admitting: Hematology and Oncology

## 2022-03-22 ENCOUNTER — Encounter: Payer: Self-pay | Admitting: Internal Medicine

## 2022-03-22 DIAGNOSIS — C50212 Malignant neoplasm of upper-inner quadrant of left female breast: Secondary | ICD-10-CM | POA: Insufficient documentation

## 2022-03-22 DIAGNOSIS — Z170 Estrogen receptor positive status [ER+]: Secondary | ICD-10-CM | POA: Insufficient documentation

## 2022-03-22 NOTE — Progress Notes (Deleted)
    Subjective:    Patient ID: Amanda Ayers, female    DOB: August 23, 1970, 52 y.o.   MRN: [id number]      HPI Amanda Ayers is here for No chief complaint on file.        Medications and allergies reviewed with patient and updated if appropriate.  Current Outpatient Medications on File Prior to Visit  Medication Sig Dispense Refill   anastrozole (ARIMIDEX) 1 MG tablet Take 1 tablet (1 mg total) by mouth daily. 90 tablet 3   HYDROcodone-acetaminophen (NORCO/VICODIN) 5-325 MG tablet Take 1 tablet by mouth every 6 (six) hours as needed for moderate pain. 15 tablet 0   Ibuprofen-diphenhydrAMINE Cit (ADVIL PM PO) Take by mouth at bedtime as needed.     [DISCONTINUED] prochlorperazine (COMPAZINE) 10 MG tablet Take 1 tablet (10 mg total) by mouth every 6 (six) hours as needed (Nausea or vomiting). 30 tablet 1   No current facility-administered medications on file prior to visit.    Review of Systems     Objective:  There were no vitals filed for this visit. BP Readings from Last 3 Encounters:  03/18/22 124/77  01/25/22 124/77  11/19/21 128/64   Wt Readings from Last 3 Encounters:  01/25/22 246 lb (111.6 kg)  11/19/21 244 lb (110.7 kg)  11/05/21 244 lb 4.8 oz (110.8 kg)   There is no height or weight on file to calculate BMI.    Physical Exam         Assessment & Plan:    See Problem List for Assessment and Plan of chronic medical problems.

## 2022-03-22 NOTE — Progress Notes (Signed)
  Radiation Oncology         [phone] ________________________________  Name: Amanda Ayers MRN: [id number]  Date of Service: 03/22/2022  DOB: 23-Jun-1970  Post Treatment Telephone Note  Diagnosis:  Stage IA, [id number], grade 3, ER/PR positive invasive ductal carcinoma of the left breast   Intent: Curative  Radiation Treatment Dates: 01/11/2022 through 02/05/2022 Site Technique Total Dose (Gy) Dose per Fx (Gy) Completed Fx Beam Energies  Breast, Left: Breast_L 3D 42.56/42.56 2.66 16/16 6X  Breast, Left: Breast_L_Bst 3D 8/8 2 4/4 6X, 10X   (as documented in provider EOT note)   The patient was available for call today.   Symptoms of fatigue have improved since completing therapy.  Symptoms of skin changes have  improved since completing therapy.  The patient was encouraged to avoid sun exposure in the area of prior treatment for up to one year following radiation with either sunscreen or by the style of clothing worn in the sun.  The patient has scheduled follow up with her medical oncologist Dr. Chryl Heck for ongoing surveillance, and was encouraged to call if she develops concerns or questions regarding radiation.  This concludes the interview.   Leandra Kern, LPN

## 2022-03-23 ENCOUNTER — Ambulatory Visit: Payer: BC Managed Care – PPO | Admitting: Internal Medicine

## 2022-04-13 ENCOUNTER — Other Ambulatory Visit: Payer: Self-pay

## 2022-04-13 ENCOUNTER — Telehealth: Payer: Self-pay | Admitting: Hematology and Oncology

## 2022-04-13 ENCOUNTER — Other Ambulatory Visit: Payer: Self-pay | Admitting: *Deleted

## 2022-04-13 ENCOUNTER — Inpatient Hospital Stay (HOSPITAL_BASED_OUTPATIENT_CLINIC_OR_DEPARTMENT_OTHER): Payer: BC Managed Care – PPO | Admitting: Physician Assistant

## 2022-04-13 ENCOUNTER — Encounter: Payer: Self-pay | Admitting: Hematology and Oncology

## 2022-04-13 VITALS — BP 123/62 | HR 81 | Temp 98.4°F | Resp 16 | Wt 253.9 lb

## 2022-04-13 DIAGNOSIS — Z170 Estrogen receptor positive status [ER+]: Secondary | ICD-10-CM

## 2022-04-13 DIAGNOSIS — R21 Rash and other nonspecific skin eruption: Secondary | ICD-10-CM | POA: Diagnosis not present

## 2022-04-13 DIAGNOSIS — Z5111 Encounter for antineoplastic chemotherapy: Secondary | ICD-10-CM | POA: Diagnosis not present

## 2022-04-13 DIAGNOSIS — C50212 Malignant neoplasm of upper-inner quadrant of left female breast: Secondary | ICD-10-CM

## 2022-04-13 DIAGNOSIS — R600 Localized edema: Secondary | ICD-10-CM | POA: Diagnosis not present

## 2022-04-13 LAB — CBC WITH DIFFERENTIAL (CANCER CENTER ONLY)
Abs Immature Granulocytes: 0.01 10*3/uL (ref 0.00–0.07)
Basophils Absolute: 0 10*3/uL (ref 0.0–0.1)
Basophils Relative: 0 %
Eosinophils Absolute: 0.1 10*3/uL (ref 0.0–0.5)
Eosinophils Relative: 3 %
HCT: 33.7 % — ABNORMAL LOW (ref 36.0–46.0)
Hemoglobin: 11.1 g/dL — ABNORMAL LOW (ref 12.0–15.0)
Immature Granulocytes: 0 %
Lymphocytes Relative: 14 %
Lymphs Abs: 0.6 10*3/uL — ABNORMAL LOW (ref 0.7–4.0)
MCH: 30.3 pg (ref 26.0–34.0)
MCHC: 32.9 g/dL (ref 30.0–36.0)
MCV: 92.1 fL (ref 80.0–100.0)
Monocytes Absolute: 0.4 10*3/uL (ref 0.1–1.0)
Monocytes Relative: 8 %
Neutro Abs: 3.3 10*3/uL (ref 1.7–7.7)
Neutrophils Relative %: 75 %
Platelet Count: 277 10*3/uL (ref 150–400)
RBC: 3.66 MIL/uL — ABNORMAL LOW (ref 3.87–5.11)
RDW: 13.7 % (ref 11.5–15.5)
WBC Count: 4.5 10*3/uL (ref 4.0–10.5)
nRBC: 0 % (ref 0.0–0.2)

## 2022-04-13 LAB — CMP (CANCER CENTER ONLY)
ALT: 9 U/L (ref 0–44)
AST: 15 U/L (ref 15–41)
Albumin: 4.4 g/dL (ref 3.5–5.0)
Alkaline Phosphatase: 37 U/L — ABNORMAL LOW (ref 38–126)
Anion gap: 5 (ref 5–15)
BUN: 17 mg/dL (ref 6–20)
CO2: 29 mmol/L (ref 22–32)
Calcium: 9.2 mg/dL (ref 8.9–10.3)
Chloride: 106 mmol/L (ref 98–111)
Creatinine: 0.87 mg/dL (ref 0.44–1.00)
GFR, Estimated: >60 mL/min (ref >60)
Glucose, Bld: 81 mg/dL (ref 70–99)
Potassium: 4.1 mmol/L (ref 3.5–5.1)
Sodium: 140 mmol/L (ref 135–145)
Total Bilirubin: 0.4 mg/dL (ref 0.3–1.2)
Total Protein: 7.1 g/dL (ref 6.5–8.1)

## 2022-04-13 MED ORDER — FUROSEMIDE 20 MG PO TABS: 20.0000 mg | ORAL_TABLET | Freq: Every day | ORAL | 0 refills | Status: DC

## 2022-04-13 MED ORDER — FAMOTIDINE 20 MG PO TABS: 20.0000 mg | ORAL_TABLET | Freq: Two times a day (BID) | ORAL | 0 refills | Status: DC

## 2022-04-13 NOTE — Progress Notes (Signed)
Symptom Management Consult note [facility]    Patient Care Team: Hoyt Koch, MD as PCP - General (Internal Medicine) Stark Klein, MD as Consulting Physician (General Surgery) Benay Pike, MD as Consulting Physician (Hematology and Oncology) Kyung Rudd, MD as Consulting Physician (Radiation Oncology) Mauro Kaufmann, RN as Oncology Nurse Navigator Rockwell Germany, RN as Oncology Nurse Navigator    Name / MRN / DOB: Amanda Ayers  [id number]  05-Apr-1970   Date of visit: 04/13/2022   Chief Complaint/Reason for visit: rash and leg swelling   Current Therapy: PO Anastrozole and Zoladex injection   Last treatment:  first injection 03/18/22   ASSESSMENT & PLAN: Patient is a 52 y.o. female  with oncologic history of malignant neoplasm of upper-inner quadrant of left breast in female, estrogen receptor positive followed by Dr. Chryl Heck.  I have viewed most recent oncology note and lab work.  #Symptom management: Rash and bilateral lower extremity edema - Symptom onset is concerning for reaction to current treatment: Anastrozole and zoladex. -Exam today shows nontoxic appearing patient, hemodynamically stable.  She has 2+ bilateral lower extremity edema as well as rash on legs and torso, sparing face and groin. -Will treat with short course of Lasix and Pepcid and Benadryl.  Patient has documented allergy of prednisone with reported reaction of swelling therefore will hold off on steroid taper at this time. - CBC and CMP checked today and were overall unremarkable. -Will have her hold Anastrozole and Zoldez injection x 2 weeks.  -Discussed plan with Dr Chryl Heck. She will see patient for recheck in 2 weeks. - Strict ED precautions discussed should symptoms worsen.   #Malignant neoplasm of upper-inner quadrant of left breast in female, estrogen receptor positive  - Scheduled for Zoladex injection tomorrow, will be held as per above. - Holding Anastrozole  as per above     Heme/Onc History: Oncology History  Malignant neoplasm of upper-inner quadrant of left breast in female, estrogen receptor positive ([place])  06/19/2021 Mammogram   Mammogram from Jun 19, 2021 showed possible asymmetry in the left breast.  Diagnostic mammogram showed suspicious mass in the 10:00 location of the left breast.  Ultrasound of the left axilla negative for lymphadenopathy   07/14/2021 Initial Diagnosis   Malignant neoplasm of upper-inner quadrant of left breast in female, estrogen receptor positive [facility])    Pathology Results   Pathology from the left breast showed invasive ductal carcinoma grade 2 out of 3.  Prognostic showed ER 90% positive strong staining, PR 95% strong staining, Ki-67 of 60% and HER2 equivocal by IHC and FISH is pending   07/21/2021 Definitive Surgery   Left breast lumpectomy showed invasive ductal carcinoma measuring 8 mm, grade 3, focal high-grade DCIS, negative resection margins negative for lymphovascular or perineural invasion, all lymph nodes with no evidence of involvement.    Oncotype testing   Oncotype testing resulted at score of 29, distant risk of recurrence at 9 years of approximately 18%, absolute benefit of chemotherapy greater than 15%   07/21/2021 Oncotype testing   Oncotype score of 29, distant recurrence risk at 9 years was thought to be 18% with antiestrogen therapy alone.  There is demonstrated group average absolute chemotherapy benefit in this arm of greater than 15%.   08/25/2021 - 10/08/2021 Chemotherapy   Patient is on Treatment Plan : BREAST TC q21d     01/11/2022 - 02/05/2022 Radiation Therapy   Completed adjuvant radiation.       Interval  history-: Amanda Ayers is a 52 y.o. female with oncologic history as above presenting to [facility] today with chief complaint of rash and lower extremity swelling.  Patient presents unaccompanied to clinic today.   She recently started on anti estrogen therapy with OFS (first Zoladex  injection 03/18/22) and Anastrozole (started  on 03/09/22). She noticed the swelling in her legs x 3 days ago.  Swelling only mildly improved with elevation.  She has been wearing her compression socks without much improvement.  She denies any history of the same.  Denies any associated pain, shortness of breath or chest pain.  Patient states she noticed the rash on her lower legs yesterday.  She describes it as small red bumps.  She has associated pruritus.  This morning she noticed that rash had spread to her thighs, torso and arms.  She tried applying over-the-counter steroid cream without improvement.  Patient also endorses joint pain in her fingers when first waking up in the morning.  She noticed when getting up from a chair at work she feels stiff which is new for her.  Joint pain is not drastically affecting ADLs at this time.  Patient denies anyone in house with similar rash.  Denies any fever, chills or recent illness.    ROS  All other systems are reviewed and are negative for acute change except as noted in the HPI.    Allergies  Allergen Reactions   Prednisone Swelling     Past Medical History:  Diagnosis Date   Arthritis    R knee   Malignant neoplasm of upper-inner quadrant of left breast in female, estrogen receptor positive ([place]) 07/14/2021   Routine general medical examination at a health care facility 06/05/2021     Past Surgical History:  Procedure Laterality Date   BREAST LUMPECTOMY WITH RADIOACTIVE SEED AND SENTINEL LYMPH NODE BIOPSY Left 07/21/2021   Procedure: LEFT BREAST LUMPECTOMY WITH RADIOACTIVE SEED AND SENTINEL LYMPH NODE BIOPSY;  Surgeon: Stark Klein, MD;  Location: [facility];  Service: General;  Laterality: Left;    Social History   Socioeconomic History   Marital status: Single    Spouse name: Not on file   Number of children: Not on file   Years of education: Not on file   Highest education level: Not on file  Occupational  History   Not on file  Tobacco Use   Smoking status: Never    Passive exposure: Never   Smokeless tobacco: Never  Substance and Sexual Activity   Alcohol use: Not Currently    Alcohol/week: 1.0 standard drink of alcohol    Types: 1 Cans of beer per week   Drug use: Never   Sexual activity: Yes    Birth control/protection: None  Other Topics Concern   Not on file  Social History Narrative   Not on file   Social Determinants of Health   Financial Resource Strain: Not on file  Food Insecurity: Not on file  Transportation Needs: Not on file  Physical Activity: Not on file  Stress: Not on file  Social Connections: Not on file  Intimate Partner Violence: Not on file    Family History  Problem Relation Age of Onset   Multiple myeloma Maternal Uncle        d. 24s   Uterine cancer Maternal Grandmother        dx after age 60     Current Outpatient Medications:    famotidine (PEPCID) 20 MG tablet, Take 1  tablet (20 mg total) by mouth 2 (two) times daily for 5 days., Disp: 10 tablet, Rfl: 0   furosemide (LASIX) 20 MG tablet, Take 1 tablet (20 mg total) by mouth daily for 5 days., Disp: 5 tablet, Rfl: 0   anastrozole (ARIMIDEX) 1 MG tablet, Take 1 tablet (1 mg total) by mouth daily., Disp: 90 tablet, Rfl: 3  PHYSICAL EXAM: ECOG FS:1 - Symptomatic but completely ambulatory    Vitals:   04/13/22 1306 04/13/22 1358  BP: (!) 129/106 123/62  Pulse: 81   Resp: 16   Temp: 98.4 F (36.9 C)   TempSrc: Oral   SpO2: 100%   Weight: 253 lb 14.4 oz (115.2 kg)    Physical Exam Vitals and nursing note reviewed.  Constitutional:      Appearance: She is well-developed. She is not ill-appearing or toxic-appearing.  HENT:     Head: Normocephalic.     Nose: Nose normal.  Eyes:     Conjunctiva/sclera: Conjunctivae normal.  Neck:     Vascular: No JVD.  Cardiovascular:     Rate and Rhythm: Normal rate and regular rhythm.     Pulses: Normal pulses.     Heart sounds: Normal heart  sounds.  Pulmonary:     Effort: Pulmonary effort is normal.     Breath sounds: Normal breath sounds.  Abdominal:     General: There is no distension.  Musculoskeletal:     Cervical back: Normal range of motion.     Right lower leg: 2+ Pitting Edema present.     Left lower leg: 2+ Pitting Edema present.  Skin:    General: Skin is warm and dry.     Findings: Rash present. Rash is macular and papular.     Comments: On arms, back, and lower extremities. Spares face, groin, palms, soles.  Please see media below  Neurological:     Mental Status: She is oriented to person, place, and time.             LABORATORY DATA: I have reviewed the data as listed    Latest Ref Rng & Units 04/13/2022    1:50 PM 10/27/2021    8:57 AM 10/06/2021   10:57 AM  CBC  WBC 4.0 - 10.5 K/uL 4.5  9.0  11.7   Hemoglobin 12.0 - 15.0 g/dL 11.1  9.0  10.6   Hematocrit 36.0 - 46.0 % 33.7  28.4  31.6   Platelets 150 - 400 K/uL 277  418  462         Latest Ref Rng & Units 04/13/2022    1:50 PM 10/27/2021    8:57 AM 10/06/2021   10:57 AM  CMP  Glucose 70 - 99 mg/dL 81  112  102   BUN 6 - 20 mg/dL '17  12  21   '$ Creatinine 0.44 - 1.00 mg/dL 0.87  1.05  1.05   Sodium 135 - 145 mmol/L 140  141  140   Potassium 3.5 - 5.1 mmol/L 4.1  4.6  4.2   Chloride 98 - 111 mmol/L 106  107  107   CO2 22 - 32 mmol/L '29  29  28   '$ Calcium 8.9 - 10.3 mg/dL 9.2  9.7  10.4   Total Protein 6.5 - 8.1 g/dL 7.1  6.4  6.9   Total Bilirubin 0.3 - 1.2 mg/dL 0.4  0.2  0.5   Alkaline Phos 38 - 126 U/L 37  46  46   AST 15 -  41 U/L '15  12  10   '$ ALT 0 - 44 U/L '9  12  12        '$ RADIOGRAPHIC STUDIES (from last 24 hours if applicable) I have personally reviewed the radiological images as listed and agreed with the findings in the report. No results found.      Visit Diagnosis: 1. Malignant neoplasm of upper-inner quadrant of left breast in female, estrogen receptor positive ([place])   2. Rash in adult   3. Lower extremity  edema      Orders Placed This Encounter  Procedures   CBC with Differential ([facility] Only)    Standing Status:   Future    Number of Occurrences:   1    Standing Expiration Date:   04/14/2023   CMP ([facility] only)    Standing Status:   Future    Number of Occurrences:   1    Standing Expiration Date:   04/14/2023    All questions were answered. The patient knows to call the clinic with any problems, questions or concerns. No barriers to learning was detected.  I have spent a total of 30 minutes minutes of face-to-face and non-face-to-face time, preparing to see the patient, obtaining and/or reviewing separately obtained history, performing a medically appropriate examination, counseling and educating the patient, ordering tests, documenting clinical information in the electronic health record, and care coordination (communications with other health care professionals or caregivers).    Thank you for allowing me to participate in the care of this patient.    Barrie Folk, PA-C Department of Hematology/Oncology [facility] Phone: [phone]  [phone]    04/13/2022 2:55 PM

## 2022-04-13 NOTE — Telephone Encounter (Signed)
Patient aware of upcoming appointment

## 2022-04-13 NOTE — Progress Notes (Signed)
This RN spoke with pt per her My Chart message stating she has developed some rashes and she has bilateral leg swelling since starting the anastrozole approximately 3 weeks ago.  She is using over the counter cream with little benefit.  Per above- appt made for pt to come in to [facility].

## 2022-04-14 ENCOUNTER — Inpatient Hospital Stay: Payer: BC Managed Care – PPO

## 2022-04-14 ENCOUNTER — Ambulatory Visit: Payer: BC Managed Care – PPO | Admitting: Internal Medicine

## 2022-04-15 ENCOUNTER — Ambulatory Visit (HOSPITAL_BASED_OUTPATIENT_CLINIC_OR_DEPARTMENT_OTHER): Payer: BC Managed Care – PPO | Admitting: Physician Assistant

## 2022-04-15 ENCOUNTER — Encounter: Payer: Self-pay | Admitting: Physician Assistant

## 2022-04-15 ENCOUNTER — Encounter: Payer: Self-pay | Admitting: Hematology and Oncology

## 2022-04-15 DIAGNOSIS — R21 Rash and other nonspecific skin eruption: Secondary | ICD-10-CM

## 2022-04-15 MED ORDER — HYDROXYZINE PAMOATE 25 MG PO CAPS: 25.0000 mg | ORAL_CAPSULE | Freq: Three times a day (TID) | ORAL | 0 refills | Status: AC | PRN

## 2022-04-15 MED ORDER — PREDNISONE 20 MG PO TABS: 20.0000 mg | ORAL_TABLET | Freq: Two times a day (BID) | ORAL | 0 refills | Status: AC

## 2022-04-15 NOTE — Progress Notes (Signed)
I connected with [name] on 04/15/22 at 10:00 AM EST by telephone and verified that I am speaking with the correct person using two identifiers.   I discussed the limitations, risks, security and privacy concerns of performing an evaluation and management service by telemedicine and the availability of in-person appointments. I also discussed with the patient that there may be a patient responsible charge related to this service. The patient expressed understanding and agreed to proceed.   Other persons participating in the visit and their role in the encounter: none   Patient's location: work  Provider's location: office at [place] [address] [place]    Patient Care Team: Hoyt Koch, MD as PCP - General (Internal Medicine) Stark Klein, MD as Consulting Physician (General Surgery) Benay Pike, MD as Consulting Physician (Hematology and Oncology) Kyung Rudd, MD as Consulting Physician (Radiation Oncology) Mauro Kaufmann, RN as Oncology Nurse Navigator Rockwell Germany, RN as Oncology Nurse Navigator    Name of the patient: Amanda Ayers  [id number]  25-Oct-1970   Date of visit: 04/15/2022    Chief complaint/ Reason for visit- follow up on rash  Oncology History  Malignant neoplasm of upper-inner quadrant of left breast in female, estrogen receptor positive ([place])  06/19/2021 Mammogram   Mammogram from Jun 19, 2021 showed possible asymmetry in the left breast.  Diagnostic mammogram showed suspicious mass in the 10:00 location of the left breast.  Ultrasound of the left axilla negative for lymphadenopathy   07/14/2021 Initial Diagnosis   Malignant neoplasm of upper-inner quadrant of left breast in female, estrogen receptor positive [facility])    Pathology Results   Pathology from the left breast showed invasive ductal carcinoma grade 2 out of 3.  Prognostic showed ER 90% positive strong staining, PR 95% strong staining,  Ki-67 of 60% and HER2 equivocal by IHC and FISH is pending   07/21/2021 Definitive Surgery   Left breast lumpectomy showed invasive ductal carcinoma measuring 8 mm, grade 3, focal high-grade DCIS, negative resection margins negative for lymphovascular or perineural invasion, all lymph nodes with no evidence of involvement.    Oncotype testing   Oncotype testing resulted at score of 29, distant risk of recurrence at 9 years of approximately 18%, absolute benefit of chemotherapy greater than 15%   07/21/2021 Oncotype testing   Oncotype score of 29, distant recurrence risk at 9 years was thought to be 18% with antiestrogen therapy alone.  There is demonstrated group average absolute chemotherapy benefit in this arm of greater than 15%.   08/25/2021 - 10/08/2021 Chemotherapy   Patient is on Treatment Plan : BREAST TC q21d     01/11/2022 - 02/05/2022 Radiation Therapy   Completed adjuvant radiation.     Current Therapy: Anastrozole and Zoladex   Interval history- Amanda Ayers is a 52 y.o. female with oncologic history as above contacted via telephone today to discuss her rash.  Patient seen in symptom management clinic x 2 days ago for new rash on extremities and torso.  He was thought to be related to her anastrozole and or Zoladex injections.  Patient was also experiencing lower extremity swelling.  Patient was prescribed Lasix, she has taken 2 days so far and swelling has nearly resolved.  She states her legs are pretty much back to normal size now.  She reports that her rash has not worsened however is still present.  It has not improved.  Overnight she  experienced intense itching that kept her awake.  She has been taking scheduled Benadryl and applied over-the-counter creams as we discussed at previous clinic visit without improvement. I asked patient about the severity of her swelling as documented with her prednisone allergy. Patient reports she had leg swelling when taking prednisone last year  that was mild and resolved with elevation. She denies any associated shortness of breath or difficulty breathing associated with it.    ROS  All other systems are reviewed and are negative for acute change except as noted in the HPI.    Allergies  Allergen Reactions   Prednisone Swelling     Past Medical History:  Diagnosis Date   Arthritis    R knee   Malignant neoplasm of upper-inner quadrant of left breast in female, estrogen receptor positive ([place]) 07/14/2021   Routine general medical examination at a health care facility 06/05/2021     Past Surgical History:  Procedure Laterality Date   BREAST LUMPECTOMY WITH RADIOACTIVE SEED AND SENTINEL LYMPH NODE BIOPSY Left 07/21/2021   Procedure: LEFT BREAST LUMPECTOMY WITH RADIOACTIVE SEED AND SENTINEL LYMPH NODE BIOPSY;  Surgeon: Stark Klein, MD;  Location: [facility];  Service: General;  Laterality: Left;    Social History   Socioeconomic History   Marital status: Single    Spouse name: Not on file   Number of children: Not on file   Years of education: Not on file   Highest education level: Not on file  Occupational History   Not on file  Tobacco Use   Smoking status: Never    Passive exposure: Never   Smokeless tobacco: Never  Substance and Sexual Activity   Alcohol use: Not Currently    Alcohol/week: 1.0 standard drink of alcohol    Types: 1 Cans of beer per week   Drug use: Never   Sexual activity: Yes    Birth control/protection: None  Other Topics Concern   Not on file  Social History Narrative   Not on file   Social Determinants of Health   Financial Resource Strain: Not on file  Food Insecurity: Not on file  Transportation Needs: Not on file  Physical Activity: Not on file  Stress: Not on file  Social Connections: Not on file  Intimate Partner Violence: Not on file    Family History  Problem Relation Age of Onset   Multiple myeloma Maternal Uncle        d. 37s   Uterine cancer  Maternal Grandmother        dx after age 63     Current Outpatient Medications:    hydrOXYzine (VISTARIL) 25 MG capsule, Take 1 capsule (25 mg total) by mouth every 8 (eight) hours as needed for up to 5 days for itching. First try taking it at bedtime as it can cause drowsiness. If you tolerate it well you can take it every 8 hours if needed, Disp: 15 capsule, Rfl: 0   predniSONE (DELTASONE) 20 MG tablet, Take 1 tablet (20 mg total) by mouth 2 (two) times daily with a meal for 3 days., Disp: 6 tablet, Rfl: 0   anastrozole (ARIMIDEX) 1 MG tablet, Take 1 tablet (1 mg total) by mouth daily., Disp: 90 tablet, Rfl: 3   famotidine (PEPCID) 20 MG tablet, Take 1 tablet (20 mg total) by mouth 2 (two) times daily for 5 days., Disp: 10 tablet, Rfl: 0   furosemide (LASIX) 20 MG tablet, Take 1 tablet (20 mg total) by mouth  daily for 5 days., Disp: 5 tablet, Rfl: 0  PHYSICAL EXAM: ECOG FS:1 - Symptomatic but completely ambulatory   There were no vitals filed for this visit.  Patient speaking in clear sentences, no respiratory distress    LABORATORY DATA: I have reviewed the data as listed    Latest Ref Rng & Units 04/13/2022    1:50 PM 10/27/2021    8:57 AM 10/06/2021   10:57 AM  CBC  WBC 4.0 - 10.5 K/uL 4.5  9.0  11.7   Hemoglobin 12.0 - 15.0 g/dL 11.1  9.0  10.6   Hematocrit 36.0 - 46.0 % 33.7  28.4  31.6   Platelets 150 - 400 K/uL 277  418  462         Latest Ref Rng & Units 04/13/2022    1:50 PM 10/27/2021    8:57 AM 10/06/2021   10:57 AM  CMP  Glucose 70 - 99 mg/dL 81  112  102   BUN 6 - 20 mg/dL '17  12  21   '$ Creatinine 0.44 - 1.00 mg/dL 0.87  1.05  1.05   Sodium 135 - 145 mmol/L 140  141  140   Potassium 3.5 - 5.1 mmol/L 4.1  4.6  4.2   Chloride 98 - 111 mmol/L 106  107  107   CO2 22 - 32 mmol/L '29  29  28   '$ Calcium 8.9 - 10.3 mg/dL 9.2  9.7  10.4   Total Protein 6.5 - 8.1 g/dL 7.1  6.4  6.9   Total Bilirubin 0.3 - 1.2 mg/dL 0.4  0.2  0.5   Alkaline Phos 38 - 126 U/L 37  46  46    AST 15 - 41 U/L '15  12  10   '$ ALT 0 - 44 U/L '9  12  12        '$ RADIOGRAPHIC STUDIES: I have personally reviewed the radiological images as listed and agreed with the findings in the report. No images are attached to the encounter. No results found.   ASSESSMENT & PLAN: Patient is a 52 y.o. female  malignant neoplasm of upper-inner quadrant of left breast in female, estrogen receptor positive followed by Dr. Chryl Heck    #Pruritic rash - Seen in [facility] for the same x 2 days ago. Thought to be reaction to Anastrozole and/or Zoladex -No improvement with PO benadryl and OTC ointment. Pruritus kept her awake last night. - Engaged in shared decision making with patient to discuss prescription interventions. As rash is not improving, I recommend 3 day course of prednisone. She has an allergy to it with reaction of leg swelling. Since clinic visit the swelling has resolved after taking lasix x 2 days. I advised her to hold the remaining lasix and take prednisone. If edema returns she can finish the 3 days of lasix she already has. -For the intense pruritus she is experiencing I sent a prescription for hydroxyzine and advised her to stop the benadryl while taking. She is aware it can cause drowsiness, advised her to try taking at bedtime for the first dose to see how she tolerates it. -Patient encouraged to RTC if symptoms do not improve despite new treatment plan. -Strict ED precautions discussed should symptoms worsen.   Visit Diagnosis: 1. Rash in adult      No orders of the defined types were placed in this encounter.   All questions were answered. The patient knows to call the clinic with any problems, questions or concerns.  No barriers to learning was detected.  Time spent with patient on telephone encounter: 12 minutes   Thank you for allowing me to participate in the care of this patient.    Barrie Folk, PA-C Department of Hematology/Oncology [facility] at  [facility] Phone: [phone]  [phone]    04/15/2022 10:40 AM

## 2022-04-19 ENCOUNTER — Encounter: Payer: Self-pay | Admitting: Physician Assistant

## 2022-04-19 ENCOUNTER — Telehealth: Payer: Self-pay | Admitting: Physician Assistant

## 2022-04-19 DIAGNOSIS — R21 Rash and other nonspecific skin eruption: Secondary | ICD-10-CM

## 2022-04-19 MED ORDER — PREDNISONE 20 MG PO TABS: 20.0000 mg | ORAL_TABLET | Freq: Two times a day (BID) | ORAL | 0 refills | Status: AC

## 2022-04-19 NOTE — Telephone Encounter (Signed)
Contacted patient to discuss MyChart message.  Her rash has improved significantly although still present.  She finished 3 days of prednisone yesterday.  Engaged in shared decision making and patient feels a few more days of prednisone will help even more control the rash.  Send another prescription for 3 days of prednisone to start tomorrow.  Based on patient's symptoms she does not need Medrol Dosepak at this time.  Encourage patient to keep future oncology follow-up to discuss next steps.  Patient is agreeable with plan of care.

## 2022-04-27 ENCOUNTER — Encounter: Payer: Self-pay | Admitting: *Deleted

## 2022-04-29 ENCOUNTER — Inpatient Hospital Stay: Payer: BC Managed Care – PPO | Attending: Hematology and Oncology | Admitting: Hematology and Oncology

## 2022-04-29 ENCOUNTER — Encounter: Payer: Self-pay | Admitting: Hematology and Oncology

## 2022-04-29 VITALS — BP 112/77 | HR 84 | Temp 98.1°F | Resp 16 | Ht 69.0 in | Wt 246.9 lb

## 2022-04-29 DIAGNOSIS — C50212 Malignant neoplasm of upper-inner quadrant of left female breast: Secondary | ICD-10-CM | POA: Diagnosis present

## 2022-04-29 DIAGNOSIS — Z170 Estrogen receptor positive status [ER+]: Secondary | ICD-10-CM | POA: Insufficient documentation

## 2022-04-29 DIAGNOSIS — Z79810 Long term (current) use of selective estrogen receptor modulators (SERMs): Secondary | ICD-10-CM | POA: Insufficient documentation

## 2022-04-29 MED ORDER — TAMOXIFEN CITRATE 20 MG PO TABS: 20.0000 mg | ORAL_TABLET | Freq: Every day | ORAL | 3 refills | Status: DC

## 2022-04-29 NOTE — Progress Notes (Signed)
[place] PROGRESS NOTE  Patient Care Team: Hoyt Koch, MD as PCP - General (Internal Medicine) Stark Klein, MD as Consulting Physician (General Surgery) Benay Pike, MD as Consulting Physician (Hematology and Oncology) Kyung Rudd, MD as Consulting Physician (Radiation Oncology) Mauro Kaufmann, RN as Oncology Nurse Navigator Rockwell Germany, RN as Oncology Nurse Navigator  CHIEF COMPLAINTS/PURPOSE OF CONSULTATION:  Breast cancer  SUMMARY OF ONCOLOGIC HISTORY: Oncology History  Malignant neoplasm of upper-inner quadrant of left breast in female, estrogen receptor positive ([place])  06/19/2021 Mammogram   Mammogram from Jun 19, 2021 showed possible asymmetry in the left breast.  Diagnostic mammogram showed suspicious mass in the 10:00 location of the left breast.  Ultrasound of the left axilla negative for lymphadenopathy   07/14/2021 Initial Diagnosis   Malignant neoplasm of upper-inner quadrant of left breast in female, estrogen receptor positive [facility])    Pathology Results   Pathology from the left breast showed invasive ductal carcinoma grade 2 out of 3.  Prognostic showed ER 90% positive strong staining, PR 95% strong staining, Ki-67 of 60% and HER2 equivocal by IHC and FISH is pending   07/21/2021 Definitive Surgery   Left breast lumpectomy showed invasive ductal carcinoma measuring 8 mm, grade 3, focal high-grade DCIS, negative resection margins negative for lymphovascular or perineural invasion, all lymph nodes with no evidence of involvement.    Oncotype testing   Oncotype testing resulted at score of 29, distant risk of recurrence at 9 years of approximately 18%, absolute benefit of chemotherapy greater than 15%   07/21/2021 Oncotype testing   Oncotype score of 29, distant recurrence risk at 9 years was thought to be 18% with antiestrogen therapy alone.  There is demonstrated group average absolute chemotherapy benefit in this arm of greater than  15%.   08/25/2021 - 10/08/2021 Chemotherapy   Patient is on Treatment Plan : BREAST TC q21d     01/11/2022 - 02/05/2022 Radiation Therapy   Completed adjuvant radiation.    Interval History  Amanda Ayers presents for follow-up. She completed 3 out of 4 planned cycles of TC. She completed radiation in Dec 2023. She is now on OFS and AI. She called a couple weeks ago with a skin rash and felt that this was related to anastrozole. We recommended she stop the medicine and come for a follow up. She says since she stopped the anastrozole, she didn't feel the arthralgias, feels much better overall. She doesn't want to do the zoladex either at this time. She would like to try tamoxifen instead. Rest of the pertinent 10 point ROS reviewed and negative  MEDICAL HISTORY:  Past Medical History:  Diagnosis Date   Arthritis    R knee   Malignant neoplasm of upper-inner quadrant of left breast in female, estrogen receptor positive ([place]) 07/14/2021   Routine general medical examination at a health care facility 06/05/2021    SURGICAL HISTORY: Past Surgical History:  Procedure Laterality Date   BREAST LUMPECTOMY WITH RADIOACTIVE SEED AND SENTINEL LYMPH NODE BIOPSY Left 07/21/2021   Procedure: LEFT BREAST LUMPECTOMY WITH RADIOACTIVE SEED AND SENTINEL LYMPH NODE BIOPSY;  Surgeon: Stark Klein, MD;  Location: [facility];  Service: General;  Laterality: Left;    SOCIAL HISTORY: Social History   Socioeconomic History   Marital status: Single    Spouse name: Not on file   Number of children: Not on file   Years of education: Not on file   Highest education level: Not on  file  Occupational History   Not on file  Tobacco Use   Smoking status: Never    Passive exposure: Never   Smokeless tobacco: Never  Substance and Sexual Activity   Alcohol use: Not Currently    Alcohol/week: 1.0 standard drink of alcohol    Types: 1 Cans of beer per week   Drug use: Never   Sexual activity: Yes     Birth control/protection: None  Other Topics Concern   Not on file  Social History Narrative   Not on file   Social Determinants of Health   Financial Resource Strain: Not on file  Food Insecurity: Not on file  Transportation Needs: Not on file  Physical Activity: Not on file  Stress: Not on file  Social Connections: Not on file  Intimate Partner Violence: Not on file    FAMILY HISTORY: Family History  Problem Relation Age of Onset   Multiple myeloma Maternal Uncle        d. 19s   Uterine cancer Maternal Grandmother        dx after age 67    ALLERGIES:  is allergic to prednisone.  MEDICATIONS:  Current Outpatient Medications  Medication Sig Dispense Refill   anastrozole (ARIMIDEX) 1 MG tablet Take 1 tablet (1 mg total) by mouth daily. 90 tablet 3   famotidine (PEPCID) 20 MG tablet Take 1 tablet (20 mg total) by mouth 2 (two) times daily for 5 days. 10 tablet 0   furosemide (LASIX) 20 MG tablet Take 1 tablet (20 mg total) by mouth daily for 5 days. 5 tablet 0   No current facility-administered medications for this visit.     PHYSICAL EXAMINATION: ECOG PERFORMANCE STATUS: 0 - Asymptomatic  Physical Exam Constitutional:      Appearance: Normal appearance.  Cardiovascular:     Rate and Rhythm: Normal rate and regular rhythm.  Pulmonary:     Effort: Pulmonary effort is normal.     Breath sounds: Normal breath sounds.  Musculoskeletal:        General: No swelling.     Cervical back: Normal range of motion and neck supple. No rigidity.  Lymphadenopathy:     Cervical: No cervical adenopathy.  Skin:    General: Skin is warm.     Findings: No bruising.  Neurological:     General: No focal deficit present.     Mental Status: She is alert.    LABORATORY DATA:  I have reviewed the data as listed Lab Results  Component Value Date   WBC 4.5 04/13/2022   HGB 11.1 (L) 04/13/2022   HCT 33.7 (L) 04/13/2022   MCV 92.1 04/13/2022   PLT 277 04/13/2022   Lab  Results  Component Value Date   NA 140 04/13/2022   K 4.1 04/13/2022   CL 106 04/13/2022   CO2 29 04/13/2022    RADIOGRAPHIC STUDIES: I have personally reviewed the radiological reports and agreed with the findings in the report.  ASSESSMENT AND PLAN:  Amanda Ayers is a 52 y.o. female who presents to the clinic for continued management for left breast cancer.  #Malignant neoplasm of upper-inner quadrant of left breast  --Grade 2 out of 3, ER +90% strong staining PR 95% strong staining, Ki-67 of 60% and HER2 equivocal by IHC and FISH is negative. --S/P  left breast lumpectomy with final pathology showing 8 mm grade 3 IDC, negative margins, no evidence of lymph node involvement. --Oncotype score resulted at 29, distant risk of recurrence  at 9 years of 18% and absolute benefit of chemotherapy greater than 15%. --Recommend adjuvant chemotherapy with Taxotere and Cytoxan every 21 days x 4 cycles.  She completed 3 out of 4 planned cycles of TC.  She then underwent adjuvant radiation.  She is currently on OFS with aromatase inhibitors. -- She tells me that the ovarian suppression and anastrozole gave her skin rash, arthralgias, stiffness in her joints, she felt like an old lady.  She was hoping we could try something different.  We have hence discussed about tamoxifen as a choice for antiestrogen therapy.  I have once again discussed about mechanism of action, adverse effects including but not limited to DVT/PE, endometrial hyperplasia, endometrial cancer, benefit on bone density.  She understands the symptoms and signs of DVT/PE. I have recommended that she try tamoxifen and return to clinic to see [place] in about 3 months.  She understands ovarian suppression with tamoxifen, ovarian suppression with aromatase inhibitors are more effective. Mammogram ordered for May.  I have spent a total of 30 minutes minutes of face-to-face and non-face-to-face time, preparing to see the patient,  performing a  medically appropriate examination, counseling and educating the patient, documenting clinical information in the electronic health record, and care coordination.   Benay Pike MD

## 2022-05-07 ENCOUNTER — Telehealth: Payer: Self-pay | Admitting: Adult Health

## 2022-05-07 NOTE — Telephone Encounter (Signed)
Per 3/21 IB reached out to patient to schedule left voicemail.

## 2022-05-11 ENCOUNTER — Ambulatory Visit: Payer: BC Managed Care – PPO | Admitting: Emergency Medicine

## 2022-05-11 ENCOUNTER — Other Ambulatory Visit: Payer: Self-pay | Admitting: Emergency Medicine

## 2022-05-11 ENCOUNTER — Encounter: Payer: Self-pay | Admitting: Emergency Medicine

## 2022-05-11 VITALS — BP 122/70 | HR 81 | Temp 98.3°F | Ht 69.0 in | Wt 255.2 lb

## 2022-05-11 DIAGNOSIS — E669 Obesity, unspecified: Secondary | ICD-10-CM

## 2022-05-11 DIAGNOSIS — E559 Vitamin D deficiency, unspecified: Secondary | ICD-10-CM

## 2022-05-11 DIAGNOSIS — R609 Edema, unspecified: Secondary | ICD-10-CM

## 2022-05-11 LAB — COMPREHENSIVE METABOLIC PANEL
ALT: 8 U/L (ref 0–35)
AST: 13 U/L (ref 0–37)
Albumin: 4.2 g/dL (ref 3.5–5.2)
Alkaline Phosphatase: 40 U/L (ref 39–117)
BUN: 21 mg/dL (ref 6–23)
CO2: 28 mEq/L (ref 19–32)
Calcium: 9.3 mg/dL (ref 8.4–10.5)
Chloride: 105 mEq/L (ref 96–112)
Creatinine, Ser: 0.94 mg/dL (ref 0.40–1.20)
GFR: 69.85 mL/min (ref >60.00)
Glucose, Bld: 87 mg/dL (ref 70–99)
Potassium: 3.8 mEq/L (ref 3.5–5.1)
Sodium: 139 mEq/L (ref 135–145)
Total Bilirubin: 0.3 mg/dL (ref 0.2–1.2)
Total Protein: 6.8 g/dL (ref 6.0–8.3)

## 2022-05-11 LAB — LIPID PANEL
Cholesterol: 176 mg/dL (ref 0–200)
HDL: 60.9 mg/dL (ref >39.00)
LDL Cholesterol: 99 mg/dL (ref 0–99)
NonHDL: 115.18
Total CHOL/HDL Ratio: 3
Triglycerides: 83 mg/dL (ref 0.0–149.0)
VLDL: 16.6 mg/dL (ref 0.0–40.0)

## 2022-05-11 LAB — CBC WITH DIFFERENTIAL/PLATELET
Basophils Absolute: 0 10*3/uL (ref 0.0–0.1)
Basophils Relative: 0.7 % (ref 0.0–3.0)
Eosinophils Absolute: 0.1 10*3/uL (ref 0.0–0.7)
Eosinophils Relative: 2.1 % (ref 0.0–5.0)
HCT: 33.4 % — ABNORMAL LOW (ref 36.0–46.0)
Hemoglobin: 11 g/dL — ABNORMAL LOW (ref 12.0–15.0)
Lymphocytes Relative: 18.5 % (ref 12.0–46.0)
Lymphs Abs: 1 10*3/uL (ref 0.7–4.0)
MCHC: 33 g/dL (ref 30.0–36.0)
MCV: 94.2 fl (ref 78.0–100.0)
Monocytes Absolute: 0.6 10*3/uL (ref 0.1–1.0)
Monocytes Relative: 10.7 % (ref 3.0–12.0)
Neutro Abs: 3.8 10*3/uL (ref 1.4–7.7)
Neutrophils Relative %: 68 % (ref 43.0–77.0)
Platelets: 272 10*3/uL (ref 150.0–400.0)
RBC: 3.54 Mil/uL — ABNORMAL LOW (ref 3.87–5.11)
RDW: 14.7 % (ref 11.5–15.5)
WBC: 5.6 10*3/uL (ref 4.0–10.5)

## 2022-05-11 LAB — VITAMIN D 25 HYDROXY (VIT D DEFICIENCY, FRACTURES): VITD: 8.94 ng/mL — ABNORMAL LOW (ref 30.00–100.00)

## 2022-05-11 LAB — TSH: TSH: 5.36 u[IU]/mL (ref 0.35–5.50)

## 2022-05-11 LAB — VITAMIN B12: Vitamin B-12: 619 pg/mL (ref 211–911)

## 2022-05-11 LAB — HEMOGLOBIN A1C: Hgb A1c MFr Bld: 5.4 % (ref 4.6–6.5)

## 2022-05-11 MED ORDER — VITAMIN D (ERGOCALCIFEROL) 1.25 MG (50000 UNIT) PO CAPS: 50000.0000 [IU] | ORAL_CAPSULE | ORAL | 1 refills | Status: DC

## 2022-05-11 NOTE — Progress Notes (Signed)
Amanda Ayers 52 y.o.   Chief Complaint  Patient presents with   Leg Swelling    Bil leg swelling, started Sunday, patient has been on her feet working for 8 days straight 11 hrs.     HISTORY OF PRESENT ILLNESS: Acute problem visit today.  Patient of Dr. Pricilla Holm This is a 52 y.o. female complaining of swelling of lower legs for the past couple days Recently had to work 8 days straight 11-hour shifts No other associated symptoms No other complaints or medical concerns today.  HPI   Prior to Admission medications   Medication Sig Start Date End Date Taking? Authorizing Provider  famotidine (PEPCID) 20 MG tablet Take 1 tablet (20 mg total) by mouth 2 (two) times daily for 5 days. 04/13/22 04/18/22  Barrie Folk, PA-C  tamoxifen (NOLVADEX) 20 MG tablet Take 1 tablet (20 mg total) by mouth daily. Patient not taking: Reported on 05/11/2022 04/29/22   Benay Pike, MD  prochlorperazine (COMPAZINE) 10 MG tablet Take 1 tablet (10 mg total) by mouth every 6 (six) hours as needed (Nausea or vomiting). 08/17/21 10/27/21  Benay Pike, MD    Allergies  Allergen Reactions   Prednisone Swelling    Patient Active Problem List   Diagnosis Date Noted   Malignant neoplasm of upper-inner quadrant of left breast in female, estrogen receptor positive ([place]) 07/14/2021   Obesity (BMI 35.0-39.9 without comorbidity) 06/05/2021   Routine general medical examination at a health care facility 06/05/2021    Past Medical History:  Diagnosis Date   Arthritis    R knee   Malignant neoplasm of upper-inner quadrant of left breast in female, estrogen receptor positive ([place]) 07/14/2021   Routine general medical examination at a health care facility 06/05/2021    Past Surgical History:  Procedure Laterality Date   BREAST LUMPECTOMY WITH RADIOACTIVE SEED AND SENTINEL LYMPH NODE BIOPSY Left 07/21/2021   Procedure: LEFT BREAST LUMPECTOMY WITH RADIOACTIVE SEED AND SENTINEL LYMPH NODE  BIOPSY;  Surgeon: Stark Klein, MD;  Location: [facility];  Service: General;  Laterality: Left;    Social History   Socioeconomic History   Marital status: Single    Spouse name: Not on file   Number of children: Not on file   Years of education: Not on file   Highest education level: Not on file  Occupational History   Not on file  Tobacco Use   Smoking status: Never    Passive exposure: Never   Smokeless tobacco: Never  Substance and Sexual Activity   Alcohol use: Not Currently    Alcohol/week: 1.0 standard drink of alcohol    Types: 1 Cans of beer per week   Drug use: Never   Sexual activity: Yes    Birth control/protection: None  Other Topics Concern   Not on file  Social History Narrative   Not on file   Social Determinants of Health   Financial Resource Strain: Not on file  Food Insecurity: Not on file  Transportation Needs: Not on file  Physical Activity: Not on file  Stress: Not on file  Social Connections: Not on file  Intimate Partner Violence: Not on file    Family History  Problem Relation Age of Onset   Multiple myeloma Maternal Uncle        d. 76s   Uterine cancer Maternal Grandmother        dx after age 12     Review of Systems  Constitutional: Negative.  Negative  for chills and fever.  HENT: Negative.  Negative for congestion and sore throat.   Respiratory: Negative.  Negative for cough and shortness of breath.   Cardiovascular:  Positive for leg swelling. Negative for chest pain and palpitations.  Gastrointestinal:  Negative for abdominal pain, nausea and vomiting.  Genitourinary: Negative.  Negative for dysuria and hematuria.  Skin: Negative.  Negative for rash.  Neurological: Negative.  Negative for dizziness and headaches.  All other systems reviewed and are negative.   Vitals:   05/11/22 1053  BP: 122/70  Pulse: 81  Temp: 98.3 F (36.8 C)  SpO2: 98%    Physical Exam Vitals reviewed.  Constitutional:       Appearance: Normal appearance. She is obese.  HENT:     Head: Normocephalic.  Eyes:     Extraocular Movements: Extraocular movements intact.  Cardiovascular:     Rate and Rhythm: Normal rate.  Pulmonary:     Effort: Pulmonary effort is normal.  Musculoskeletal:     Comments: Lower extremities: Wearing compression socks.  No tenderness.  No signs of DVT.  Mild swelling.  Excellent peripheral pulses.  Good distal sensation.  Skin:    General: Skin is warm and dry.     Capillary Refill: Capillary refill takes less than 2 seconds.  Neurological:     General: No focal deficit present.     Mental Status: She is alert and oriented to person, place, and time.  Psychiatric:        Mood and Affect: Mood normal.        Behavior: Behavior normal.      ASSESSMENT & PLAN: A total of 36 minutes was spent with the patient and counseling/coordination of care regarding preparing for this visit, review of most recent office visit notes, review of chronic medical conditions under management, review of all medications, differential diagnosis of peripheral edema and management, prognosis, documentation, and need for follow-up.  Problem List Items Addressed This Visit       Other   Obesity (BMI 35.0-39.9 without comorbidity)    Diet and nutrition discussed Advised against sugary drinks Recommend to decrease amount of daily carbohydrate intake and daily calories and increase amount of plant-based protein in her diet      Peripheral edema - Primary    Clinically stable.  No red flag signs or symptoms. Recommend leg elevation and use of compression socks Diet and nutrition discussed      Relevant Orders   CBC with Differential/Platelet   Comprehensive metabolic panel   Hemoglobin A1c   TSH   Lipid panel   Vitamin B12   VITAMIN D 25 Hydroxy (Vit-D Deficiency, Fractures)   Patient Instructions  Peripheral Edema  Peripheral edema is swelling that is caused by a buildup of fluid. Peripheral  edema most often affects the lower legs, ankles, and feet. It can also develop in the arms, hands, and face. The area of the body that has peripheral edema will look swollen. It may also feel heavy or warm. Your clothes may start to feel tight. Pressing on the area may make a temporary dent in your skin (pitting edema). You may not be able to move your swollen arm or leg as much as usual. There are many causes of peripheral edema. It can happen because of a complication of other conditions such as heart failure, kidney disease, or a problem with your circulation. It also can be a side effect of certain medicines or happen because of an infection.  It often happens to women during pregnancy. Sometimes, the cause is not known. Follow these instructions at home: Managing pain, stiffness, and swelling  Raise (elevate) your legs while you are sitting or lying down. Move around often to prevent stiffness and to reduce swelling. Do not sit or stand for long periods of time. Do not wear tight clothing. Do not wear garters on your upper legs. Exercise your legs to get your circulation going. This helps to move the fluid back into your blood vessels, and it may help the swelling go down. Wear compression stockings as told by your health care provider. These stockings help to prevent blood clots and reduce swelling in your legs. It is important that these are the correct size. These stockings should be prescribed by your doctor to prevent possible injuries. If elastic bandages or wraps are recommended, use them as told by your health care provider. Medicines Take over-the-counter and prescription medicines only as told by your health care provider. Your health care provider may prescribe medicine to help your body get rid of excess water (diuretic). Take this medicine if you are told to take it. General instructions Eat a low-salt (low-sodium) diet as told by your health care provider. Sometimes, eating less  salt may reduce swelling. Pay attention to any changes in your symptoms. Moisturize your skin daily to help prevent skin from cracking and draining. Keep all follow-up visits. This is important. Contact a health care provider if: You have a fever. You have swelling in only one leg. You have increased swelling, redness, or pain in one or both of your legs. You have drainage or sores at the area where you have edema. Get help right away if: You have edema that starts suddenly or is getting worse, especially if you are pregnant or have a medical condition. You develop shortness of breath, especially when you are lying down. You have pain in your chest or abdomen. You feel weak. You feel like you will faint. These symptoms may be an emergency. Get help right away. Call 911. Do not wait to see if the symptoms will go away. Do not drive yourself to the hospital. Summary Peripheral edema is swelling that is caused by a buildup of fluid. Peripheral edema most often affects the lower legs, ankles, and feet. Move around often to prevent stiffness and to reduce swelling. Do not sit or stand for long periods of time. Pay attention to any changes in your symptoms. Contact a health care provider if you have edema that starts suddenly or is getting worse, especially if you are pregnant or have a medical condition. Get help right away if you develop shortness of breath, especially when lying down. This information is not intended to replace advice given to you by your health care provider. Make sure you discuss any questions you have with your health care provider. Document Revised: 10/06/2020 Document Reviewed: 10/06/2020 Elsevier Patient Education  [place], MD [facility]

## 2022-05-11 NOTE — Patient Instructions (Signed)
Peripheral Edema  Peripheral edema is swelling that is caused by a buildup of fluid. Peripheral edema most often affects the lower legs, ankles, and feet. It can also develop in the arms, hands, and face. The area of the body that has peripheral edema will look swollen. It may also feel heavy or warm. Your clothes may start to feel tight. Pressing on the area may make a temporary dent in your skin (pitting edema). You may not be able to move your swollen arm or leg as much as usual. There are many causes of peripheral edema. It can happen because of a complication of other conditions such as heart failure, kidney disease, or a problem with your circulation. It also can be a side effect of certain medicines or happen because of an infection. It often happens to women during pregnancy. Sometimes, the cause is not known. Follow these instructions at home: Managing pain, stiffness, and swelling  Raise (elevate) your legs while you are sitting or lying down. Move around often to prevent stiffness and to reduce swelling. Do not sit or stand for long periods of time. Do not wear tight clothing. Do not wear garters on your upper legs. Exercise your legs to get your circulation going. This helps to move the fluid back into your blood vessels, and it may help the swelling go down. Wear compression stockings as told by your health care provider. These stockings help to prevent blood clots and reduce swelling in your legs. It is important that these are the correct size. These stockings should be prescribed by your doctor to prevent possible injuries. If elastic bandages or wraps are recommended, use them as told by your health care provider. Medicines Take over-the-counter and prescription medicines only as told by your health care provider. Your health care provider may prescribe medicine to help your body get rid of excess water (diuretic). Take this medicine if you are told to take it. General  instructions Eat a low-salt (low-sodium) diet as told by your health care provider. Sometimes, eating less salt may reduce swelling. Pay attention to any changes in your symptoms. Moisturize your skin daily to help prevent skin from cracking and draining. Keep all follow-up visits. This is important. Contact a health care provider if: You have a fever. You have swelling in only one leg. You have increased swelling, redness, or pain in one or both of your legs. You have drainage or sores at the area where you have edema. Get help right away if: You have edema that starts suddenly or is getting worse, especially if you are pregnant or have a medical condition. You develop shortness of breath, especially when you are lying down. You have pain in your chest or abdomen. You feel weak. You feel like you will faint. These symptoms may be an emergency. Get help right away. Call 911. Do not wait to see if the symptoms will go away. Do not drive yourself to the hospital. Summary Peripheral edema is swelling that is caused by a buildup of fluid. Peripheral edema most often affects the lower legs, ankles, and feet. Move around often to prevent stiffness and to reduce swelling. Do not sit or stand for long periods of time. Pay attention to any changes in your symptoms. Contact a health care provider if you have edema that starts suddenly or is getting worse, especially if you are pregnant or have a medical condition. Get help right away if you develop shortness of breath, especially when lying down.   This information is not intended to replace advice given to you by your health care provider. Make sure you discuss any questions you have with your health care provider. Document Revised: 10/06/2020 Document Reviewed: 10/06/2020 Elsevier Patient Education  2023 [employer] Inc.  

## 2022-05-11 NOTE — Assessment & Plan Note (Signed)
Clinically stable.  No red flag signs or symptoms. Recommend leg elevation and use of compression socks Diet and nutrition discussed

## 2022-05-11 NOTE — Assessment & Plan Note (Signed)
Diet and nutrition discussed Advised against sugary drinks Recommend to decrease amount of daily carbohydrate intake and daily calories and increase amount of plant-based protein in her diet

## 2022-05-12 ENCOUNTER — Inpatient Hospital Stay: Payer: BC Managed Care – PPO | Admitting: Hematology and Oncology

## 2022-05-12 ENCOUNTER — Inpatient Hospital Stay: Payer: BC Managed Care – PPO

## 2022-05-17 ENCOUNTER — Ambulatory Visit: Payer: BC Managed Care – PPO | Admitting: Internal Medicine

## 2022-05-17 ENCOUNTER — Encounter: Payer: Self-pay | Admitting: Internal Medicine

## 2022-05-17 VITALS — BP 132/80 | HR 65 | Temp 98.3°F | Ht 69.0 in | Wt 253.4 lb

## 2022-05-17 DIAGNOSIS — E559 Vitamin D deficiency, unspecified: Secondary | ICD-10-CM

## 2022-05-17 DIAGNOSIS — E669 Obesity, unspecified: Secondary | ICD-10-CM | POA: Diagnosis not present

## 2022-05-17 MED ORDER — TOPIRAMATE 50 MG PO TABS: 50.0000 mg | ORAL_TABLET | Freq: Every day | ORAL | 3 refills | Status: DC

## 2022-05-17 NOTE — Patient Instructions (Addendum)
We have sent in topiramate to help with the appetite take 1 pill daily.

## 2022-05-17 NOTE — Progress Notes (Unsigned)
   Subjective:   Patient ID: Amanda Ayers, female    DOB: [birth year], 52 y.o.   MRN: [id number]  HPI The patient is a 52 YO female coming in for concerns about her health. Also wants to discuss weight loss.   Review of Systems  Constitutional:  Positive for fatigue.  HENT: Negative.    Eyes: Negative.   Respiratory:  Negative for cough, chest tightness and shortness of breath.   Cardiovascular:  Negative for chest pain, palpitations and leg swelling.  Gastrointestinal:  Negative for abdominal distention, abdominal pain, constipation, diarrhea, nausea and vomiting.  Musculoskeletal: Negative.   Skin: Negative.   Neurological: Negative.   Psychiatric/Behavioral: Negative.      Objective:  Physical Exam Constitutional:      Appearance: She is well-developed. She is obese.  HENT:     Head: Normocephalic and atraumatic.  Cardiovascular:     Rate and Rhythm: Normal rate and regular rhythm.  Pulmonary:     Effort: Pulmonary effort is normal. No respiratory distress.     Breath sounds: Normal breath sounds. No wheezing or rales.  Abdominal:     General: Bowel sounds are normal. There is no distension.     Palpations: Abdomen is soft.     Tenderness: There is no abdominal tenderness. There is no rebound.  Musculoskeletal:     Cervical back: Normal range of motion.  Skin:    General: Skin is warm and dry.  Neurological:     Mental Status: She is alert and oriented to person, place, and time.     Coordination: Coordination normal.     Vitals:   05/17/22 0815  BP: 132/80  Pulse: 65  Temp: 98.3 F (36.8 C)  TempSrc: Oral  SpO2: 98%  Weight: 253 lb 6 oz (114.9 kg)  Height: 5\' 9"  (1.753 m)    Assessment & Plan:

## 2022-05-19 NOTE — Assessment & Plan Note (Signed)
We discussed new diagnosis of vitamin D deficiency and that she will start the weekly replacement. This may or may not help with weight loss.

## 2022-05-19 NOTE — Assessment & Plan Note (Signed)
Rx topiramate 50 mg qhs to help with appetite suppression. We have tried to prescribe wegovy and this was not covered in the past.

## 2022-06-02 ENCOUNTER — Ambulatory Visit (INDEPENDENT_AMBULATORY_CARE_PROVIDER_SITE_OTHER): Payer: BC Managed Care – PPO

## 2022-06-02 ENCOUNTER — Other Ambulatory Visit: Payer: Self-pay | Admitting: Podiatry

## 2022-06-02 ENCOUNTER — Ambulatory Visit (INDEPENDENT_AMBULATORY_CARE_PROVIDER_SITE_OTHER): Payer: BC Managed Care – PPO | Admitting: Podiatry

## 2022-06-02 DIAGNOSIS — M722 Plantar fascial fibromatosis: Secondary | ICD-10-CM

## 2022-06-02 DIAGNOSIS — M79671 Pain in right foot: Secondary | ICD-10-CM

## 2022-06-02 DIAGNOSIS — M79672 Pain in left foot: Secondary | ICD-10-CM | POA: Diagnosis not present

## 2022-06-02 MED ORDER — TRIAMCINOLONE ACETONIDE 10 MG/ML IJ SUSP
20.0000 mg | Freq: Once | INTRAMUSCULAR | Status: AC
  Administered 2022-06-02: 20 mg

## 2022-06-02 MED ORDER — DICLOFENAC SODIUM 75 MG PO TBEC: 75.0000 mg | DELAYED_RELEASE_TABLET | Freq: Two times a day (BID) | ORAL | 2 refills | Status: DC

## 2022-06-02 NOTE — Progress Notes (Signed)
Subjective:   Patient ID: Amanda Ayers, female   DOB: 52 y.o.   MRN: [id number]   HPI Patient states that she has started to develop severe pain in the bottom of both heels and that this has been going on in the recent period of time but she has had history of this and does work on [employer].  Patient points to both heels states they both bother her and make it hard to walk   ROS      Objective:  Physical Exam  Neurovascular status intact moderate depression of the arch noted acute inflammation prone of the medial fascial band and into the central band bilateral at its insertion calcaneus with mild equinus     Assessment:  Acute fasciitis bilateral with diminishment of arch height inflammation tingling of the big toe right which may be due to previous chemo that she took.  Patient does work at [employer]:  H&P reviewed condition and x-ray sterile prep injected the fascial band at insertion 3 mg Kenalog 5 mg Xylocaine applied fascial brace bilateral to reduce stress on her feet and discussed long-term orthotics that if we can improve her will be made at next visit.  She has had cancer but is doing well with this can take an anti-inflammatory placed on diclofenac and advised on supportive shoes.  Reappoint to recheck 2 weeks  X-rays indicate large plantar spur formation bilateral with moderate depression of the arch bilateral no indication of stress fracture

## 2022-06-02 NOTE — Patient Instructions (Signed)

## 2022-06-16 ENCOUNTER — Ambulatory Visit: Payer: BC Managed Care – PPO | Admitting: Podiatry

## 2022-06-18 ENCOUNTER — Ambulatory Visit (INDEPENDENT_AMBULATORY_CARE_PROVIDER_SITE_OTHER): Payer: BC Managed Care – PPO | Admitting: Podiatry

## 2022-06-18 DIAGNOSIS — M722 Plantar fascial fibromatosis: Secondary | ICD-10-CM | POA: Diagnosis not present

## 2022-06-20 NOTE — Progress Notes (Signed)
Subjective:   Patient ID: Amanda Ayers, female   DOB: 52 y.o.   MRN: [id number]   HPI Patient states doing much better with significant reduction of discomfort right but still painful if she is on it a lot   ROS      Objective:  Physical Exam  Neurovascular status intact with patient's right foot showing improvement but still pain when pressed to a significant nature with moderate depression of the arch     Assessment:  Planter fasciitis right still present with improvement     Plan:  H&P reviewed recommended long-term orthotics and patient is fitted for custom orthotic devices today and casting is done.  Discussed continued stretching and shoe gear modification and reappoint when orthotics are ready

## 2022-06-24 ENCOUNTER — Other Ambulatory Visit: Payer: BC Managed Care – PPO

## 2022-07-01 ENCOUNTER — Telehealth: Payer: Self-pay | Admitting: Adult Health

## 2022-07-01 NOTE — Telephone Encounter (Signed)
Rescheduled appointment per provider PAL. Left voicemail. 

## 2022-07-06 ENCOUNTER — Ambulatory Visit
Admission: RE | Admit: 2022-07-06 | Discharge: 2022-07-06 | Disposition: A | Discharge status: Home / Self Care | Payer: BC Managed Care – PPO | Source: Ambulatory Visit | Attending: Hematology and Oncology | Admitting: Hematology and Oncology

## 2022-07-06 DIAGNOSIS — C50212 Malignant neoplasm of upper-inner quadrant of left female breast: Secondary | ICD-10-CM

## 2022-07-06 HISTORY — DX: Personal history of antineoplastic chemotherapy: Z92.21

## 2022-07-06 HISTORY — DX: Personal history of irradiation: Z92.3

## 2022-07-07 ENCOUNTER — Encounter: Payer: BC Managed Care – PPO | Admitting: Adult Health

## 2022-07-14 ENCOUNTER — Telehealth: Payer: Self-pay | Admitting: Hematology and Oncology

## 2022-07-14 NOTE — Telephone Encounter (Signed)
Left patient a vm regarding upcoming appointment  

## 2022-07-19 ENCOUNTER — Encounter: Payer: Self-pay | Admitting: *Deleted

## 2022-07-19 ENCOUNTER — Inpatient Hospital Stay: Payer: BC Managed Care – PPO | Attending: Hematology and Oncology | Admitting: Adult Health

## 2022-07-19 ENCOUNTER — Telehealth: Payer: Self-pay | Admitting: *Deleted

## 2022-07-19 NOTE — Telephone Encounter (Signed)
Called pt about missed appt. Today. Left vmail for pt to call and reschedule.

## 2022-07-20 ENCOUNTER — Encounter (HOSPITAL_COMMUNITY): Payer: Self-pay

## 2022-07-23 ENCOUNTER — Ambulatory Visit: Payer: BC Managed Care – PPO | Admitting: Hematology and Oncology

## 2022-08-03 ENCOUNTER — Encounter: Payer: Self-pay | Admitting: Internal Medicine

## 2022-08-03 MED ORDER — SEMAGLUTIDE-WEIGHT MANAGEMENT 1 MG/0.5ML ~~LOC~~ SOAJ: 1.0000 mg | SUBCUTANEOUS | 0 refills | Status: AC

## 2022-08-03 MED ORDER — SEMAGLUTIDE-WEIGHT MANAGEMENT 0.5 MG/0.5ML ~~LOC~~ SOAJ: 0.5000 mg | SUBCUTANEOUS | 0 refills | Status: AC

## 2022-08-03 MED ORDER — SEMAGLUTIDE-WEIGHT MANAGEMENT 0.25 MG/0.5ML ~~LOC~~ SOAJ: 0.2500 mg | SUBCUTANEOUS | 0 refills | Status: AC

## 2022-08-03 MED ORDER — SEMAGLUTIDE-WEIGHT MANAGEMENT 1.7 MG/0.75ML ~~LOC~~ SOAJ: 1.7000 mg | SUBCUTANEOUS | 0 refills | Status: DC

## 2022-08-03 MED ORDER — SEMAGLUTIDE-WEIGHT MANAGEMENT 2.4 MG/0.75ML ~~LOC~~ SOAJ: 2.4000 mg | SUBCUTANEOUS | 0 refills | Status: DC

## 2022-08-05 ENCOUNTER — Inpatient Hospital Stay: Payer: BC Managed Care – PPO | Admitting: Hematology and Oncology

## 2022-08-05 ENCOUNTER — Other Ambulatory Visit: Payer: Self-pay

## 2022-08-05 ENCOUNTER — Encounter: Payer: Self-pay | Admitting: Podiatry

## 2022-08-05 ENCOUNTER — Encounter: Payer: Self-pay | Admitting: Internal Medicine

## 2022-08-05 ENCOUNTER — Ambulatory Visit (INDEPENDENT_AMBULATORY_CARE_PROVIDER_SITE_OTHER): Payer: BC Managed Care – PPO | Admitting: Podiatry

## 2022-08-05 VITALS — BP 116/67 | HR 74

## 2022-08-05 DIAGNOSIS — B070 Plantar wart: Secondary | ICD-10-CM

## 2022-08-05 DIAGNOSIS — Z170 Estrogen receptor positive status [ER+]: Secondary | ICD-10-CM

## 2022-08-05 DIAGNOSIS — C50212 Malignant neoplasm of upper-inner quadrant of left female breast: Secondary | ICD-10-CM

## 2022-08-05 DIAGNOSIS — M722 Plantar fascial fibromatosis: Secondary | ICD-10-CM | POA: Diagnosis not present

## 2022-08-05 DIAGNOSIS — M779 Enthesopathy, unspecified: Secondary | ICD-10-CM

## 2022-08-05 MED ORDER — DICLOFENAC SODIUM 75 MG PO TBEC
75.0000 mg | DELAYED_RELEASE_TABLET | Freq: Two times a day (BID) | ORAL | 2 refills | Status: DC
Start: 2022-08-05 — End: 2022-11-24

## 2022-08-06 NOTE — Progress Notes (Signed)
Subjective:   Patient ID: Amanda Ayers, female   DOB: 52 y.o.   MRN: [id number]   HPI Patient presents stating that I am getting pain in both my heels and I have lesions underneath my feet that have become painful right foot does not remember injury   ROS      Objective:  Physical Exam  Neurovascular status intact with patient's heels moderately improved from before but does need orthotics and can do casting's today with lesions right painful when pressed pinpoint bleeding     Assessment:  Chronic fasciitis with structural integrity loss along with lesion formation     Plan:  H&P reviewed and went ahead today casted for functional orthotics to reduce stress on both feet and I went ahead debrided lesions right apply chemical agent to create immune response sterile dressings and will be seen back as needed

## 2022-08-06 NOTE — Progress Notes (Signed)
[facility] PROGRESS NOTE  Patient Care Team: Myrlene Broker, MD as PCP - General (Internal Medicine) Almond Lint, MD as Consulting Physician (General Surgery) Rachel Moulds, MD as Consulting Physician (Hematology and Oncology) Dorothy Puffer, MD as Consulting Physician (Radiation Oncology) Pershing Proud, RN as Oncology Nurse Navigator Donnelly Angelica, RN as Oncology Nurse Navigator  CHIEF COMPLAINTS/PURPOSE OF CONSULTATION:  Breast cancer  SUMMARY OF ONCOLOGIC HISTORY: Oncology History  Malignant neoplasm of upper-inner quadrant of left breast in female, estrogen receptor positive (HCC)  06/19/2021 Mammogram   Mammogram from Jun 19, 2021 showed possible asymmetry in the left breast.  Diagnostic mammogram showed suspicious mass in the 10:00 location of the left breast.  Ultrasound of the left axilla negative for lymphadenopathy    Pathology Results   Pathology from the left breast showed invasive ductal carcinoma grade 2 out of 3.  Prognostic showed ER 90% positive strong staining, PR 95% strong staining, Ki-67 of 60% and HER2 equivocal by IHC and FISH is pending   07/21/2021 Definitive Surgery   Left breast lumpectomy: IDC 0.8cm, grade 3, focal high-grade DCIS, margins negative, negative for LVI, 2 SLN negative for metastases   07/21/2021 Oncotype testing   Oncotype score of 29, distant recurrence risk at 9 years was thought to be 18% with antiestrogen therapy alone.  There is demonstrated group average absolute chemotherapy benefit in this arm of greater than 15%.   08/25/2021 - 10/08/2021 Chemotherapy   Patient is on Treatment Plan : BREAST TC q21d     01/11/2022 - 02/05/2022 Radiation Therapy   Site Technique Total Dose (Gy) Dose per Fx (Gy) Completed Fx Beam Energies  Breast, Left: Breast_L 3D 42.56/42.56 2.66 16/16 6X  Breast, Left: Breast_L_Bst 3D 8/8 2 4/4 6X, 10X    04/2022 -  Anti-estrogen oral therapy   Tamoxifen   07/19/2022 Cancer Staging   Staging  form: Breast, AJCC 8th Edition - Pathologic: Stage IA (pT1b, pN0, cM0, G3, ER+, PR+, HER2-, Oncotype DX score: 29) - Signed by Loa Socks, NP on 07/19/2022 Multigene prognostic tests performed: Oncotype DX Recurrence score range: Greater than or equal to 11 Histologic grading system: 3 grade system    Interval History No show MEDICAL HISTORY:  Past Medical History:  Diagnosis Date   Arthritis    R knee   Malignant neoplasm of upper-inner quadrant of left breast in female, estrogen receptor positive (HCC) 07/14/2021   Personal history of chemotherapy    3 treatments   Personal history of radiation therapy    30 days   Routine general medical examination at a health care facility 06/05/2021    SURGICAL HISTORY: Past Surgical History:  Procedure Laterality Date   BREAST LUMPECTOMY WITH RADIOACTIVE SEED AND SENTINEL LYMPH NODE BIOPSY Left 07/21/2021   Procedure: LEFT BREAST LUMPECTOMY WITH RADIOACTIVE SEED AND SENTINEL LYMPH NODE BIOPSY;  Surgeon: Almond Lint, MD;  Location: [facility];  Service: General;  Laterality: Left;    SOCIAL HISTORY: Social History   Socioeconomic History   Marital status: Single    Spouse name: Not on file   Number of children: Not on file   Years of education: Not on file   Highest education level: 12th grade  Occupational History   Not on file  Tobacco Use   Smoking status: Never    Passive exposure: Never   Smokeless tobacco: Never  Substance and Sexual Activity   Alcohol use: Not Currently    Alcohol/week: 1.0 standard  drink of alcohol    Types: 1 Cans of beer per week   Drug use: Never   Sexual activity: Yes    Birth control/protection: None  Other Topics Concern   Not on file  Social History Narrative   Not on file   Social Determinants of Health   Financial Resource Strain: Low Risk  (05/13/2022)   Overall Financial Resource Strain (CARDIA)    Difficulty of Paying Living Expenses: Not hard at all   Food Insecurity: No Food Insecurity (05/13/2022)   Hunger Vital Sign    Worried About Running Out of Food in the Last Year: Never true    Ran Out of Food in the Last Year: Never true  Transportation Needs: No Transportation Needs (05/13/2022)   PRAPARE - Administrator, Civil Service (Medical): No    Lack of Transportation (Non-Medical): No  Physical Activity: Unknown (05/13/2022)   Exercise Vital Sign    Days of Exercise per Week: 0 days    Minutes of Exercise per Session: Not on file  Stress: No Stress Concern Present (05/13/2022)   [employer] of Occupational Health - Occupational Stress Questionnaire    Feeling of Stress : Only a little  Social Connections: Moderately Isolated (05/13/2022)   Social Connection and Isolation Panel [NHANES]    Frequency of Communication with Friends and Family: More than three times a week    Frequency of Social Gatherings with Friends and Family: Once a week    Attends Religious Services: More than 4 times per year    Active Member of [employer] or Organizations: No    Attends Engineer, structural: Not on file    Marital Status: Never married  Catering manager Violence: Not on file    FAMILY HISTORY: Family History  Problem Relation Age of Onset   Multiple myeloma Maternal Uncle        d. 33s   Uterine cancer Maternal Grandmother        dx after age 39    ALLERGIES:  is allergic to prednisone.  MEDICATIONS:  Current Outpatient Medications  Medication Sig Dispense Refill   diclofenac (VOLTAREN) 75 MG EC tablet Take 1 tablet (75 mg total) by mouth 2 (two) times daily. 50 tablet 2   diclofenac (VOLTAREN) 75 MG EC tablet Take 1 tablet (75 mg total) by mouth 2 (two) times daily. 50 tablet 2   Semaglutide-Weight Management 0.25 MG/0.5ML SOAJ Inject 0.25 mg into the skin once a week for 28 days. 2 mL 0   [START ON 09/01/2022] Semaglutide-Weight Management 0.5 MG/0.5ML SOAJ Inject 0.5 mg into the skin once a week for 28 days. 2  mL 0   [START ON 09/30/2022] Semaglutide-Weight Management 1 MG/0.5ML SOAJ Inject 1 mg into the skin once a week for 28 days. 2 mL 0   [START ON 10/29/2022] Semaglutide-Weight Management 1.7 MG/0.75ML SOAJ Inject 1.7 mg into the skin once a week for 28 days. 3 mL 0   [START ON 11/27/2022] Semaglutide-Weight Management 2.4 MG/0.75ML SOAJ Inject 2.4 mg into the skin once a week for 28 days. 3 mL 0   tamoxifen (NOLVADEX) 20 MG tablet Take 1 tablet (20 mg total) by mouth daily. 90 tablet 3   topiramate (TOPAMAX) 50 MG tablet Take 1 tablet (50 mg total) by mouth daily. 30 tablet 3   Vitamin D, Ergocalciferol, (DRISDOL) 1.25 MG (50000 UNIT) CAPS capsule Take 1 capsule (50,000 Units total) by mouth every 7 (seven) days. 7 capsule 1  No current facility-administered medications for this visit.     PHYSICAL EXAMINATION: ECOG PERFORMANCE STATUS: 0 - Asymptomatic  Physical Exam Constitutional:      Appearance: Normal appearance.  Cardiovascular:     Rate and Rhythm: Normal rate and regular rhythm.  Pulmonary:     Effort: Pulmonary effort is normal.     Breath sounds: Normal breath sounds.  Musculoskeletal:        General: No swelling.     Cervical back: Normal range of motion and neck supple. No rigidity.  Lymphadenopathy:     Cervical: No cervical adenopathy.  Skin:    General: Skin is warm.     Findings: No bruising.  Neurological:     General: No focal deficit present.     Mental Status: She is alert.    LABORATORY DATA:  I have reviewed the data as listed Lab Results  Component Value Date   WBC 5.6 05/11/2022   HGB 11.0 (L) 05/11/2022   HCT 33.4 (L) 05/11/2022   MCV 94.2 05/11/2022   PLT 272.0 05/11/2022   Lab Results  Component Value Date   NA 139 05/11/2022   K 3.8 05/11/2022   CL 105 05/11/2022   CO2 28 05/11/2022    RADIOGRAPHIC STUDIES: I have personally reviewed the radiological reports and agreed with the findings in the report.  ASSESSMENT AND PLAN:  Amanda Ayers is a 52 y.o. female who presents to the clinic for continued management for left breast cancer.  #Malignant neoplasm of upper-inner quadrant of left breast  --Grade 2 out of 3, ER +90% strong staining PR 95% strong staining, Ki-67 of 60% and HER2 equivocal by IHC and FISH is negative. --S/P  left breast lumpectomy with final pathology showing 8 mm grade 3 IDC, negative margins, no evidence of lymph node involvement. --Oncotype score resulted at 29, distant risk of recurrence at 9 years of 18% and absolute benefit of chemotherapy greater than 15%. --Recommend adjuvant chemotherapy with Taxotere and Cytoxan every 21 days x 4 cycles.  She completed 3 out of 4 planned cycles of TC.  She then underwent adjuvant radiation.  She is currently on OFS with aromatase inhibitors.  NO SHOW I have spent a total of 30 minutes minutes of face-to-face and non-face-to-face time, preparing to see the patient,  performing a medically appropriate examination, counseling and educating the patient, documenting clinical information in the electronic health record, and care coordination.   Rachel Moulds MD

## 2022-08-11 ENCOUNTER — Other Ambulatory Visit: Payer: Self-pay | Admitting: Internal Medicine

## 2022-08-17 ENCOUNTER — Other Ambulatory Visit: Payer: BC Managed Care – PPO

## 2022-08-23 ENCOUNTER — Telehealth: Payer: Self-pay | Admitting: Hematology and Oncology

## 2022-08-23 NOTE — Telephone Encounter (Signed)
Patient is aware of upcoming appointment times/dates.  

## 2022-08-27 ENCOUNTER — Telehealth: Payer: Self-pay | Admitting: Podiatry

## 2022-08-27 NOTE — Telephone Encounter (Signed)
Orthotics. Lvm for to call to schedule an appt to pick them up.

## 2022-08-31 ENCOUNTER — Other Ambulatory Visit: Payer: BC Managed Care – PPO

## 2022-10-21 ENCOUNTER — Emergency Department (HOSPITAL_BASED_OUTPATIENT_CLINIC_OR_DEPARTMENT_OTHER): Payer: BC Managed Care – PPO

## 2022-10-21 ENCOUNTER — Other Ambulatory Visit: Payer: Self-pay

## 2022-10-21 ENCOUNTER — Other Ambulatory Visit (HOSPITAL_BASED_OUTPATIENT_CLINIC_OR_DEPARTMENT_OTHER): Payer: Self-pay

## 2022-10-21 ENCOUNTER — Emergency Department (HOSPITAL_BASED_OUTPATIENT_CLINIC_OR_DEPARTMENT_OTHER)
Admission: EM | Admit: 2022-10-21 | Discharge: 2022-10-21 | Disposition: A | Discharge status: Home / Self Care | Payer: BC Managed Care – PPO | Attending: Emergency Medicine | Admitting: Emergency Medicine

## 2022-10-21 ENCOUNTER — Encounter (HOSPITAL_BASED_OUTPATIENT_CLINIC_OR_DEPARTMENT_OTHER): Payer: Self-pay | Admitting: Emergency Medicine

## 2022-10-21 DIAGNOSIS — T148XXA Other injury of unspecified body region, initial encounter: Secondary | ICD-10-CM

## 2022-10-21 DIAGNOSIS — X509XXA Other and unspecified overexertion or strenuous movements or postures, initial encounter: Secondary | ICD-10-CM | POA: Diagnosis not present

## 2022-10-21 DIAGNOSIS — S39012A Strain of muscle, fascia and tendon of lower back, initial encounter: Secondary | ICD-10-CM | POA: Diagnosis not present

## 2022-10-21 DIAGNOSIS — R109 Unspecified abdominal pain: Secondary | ICD-10-CM | POA: Diagnosis present

## 2022-10-21 LAB — URINALYSIS, ROUTINE W REFLEX MICROSCOPIC
Bilirubin Urine: NEGATIVE
Glucose, UA: NEGATIVE mg/dL
Hgb urine dipstick: NEGATIVE
Ketones, ur: 15 mg/dL — AB
Nitrite: NEGATIVE
Protein, ur: NEGATIVE mg/dL
Specific Gravity, Urine: 1.019 (ref 1.005–1.030)
pH: 5.5 (ref 5.0–8.0)

## 2022-10-21 LAB — CBC WITH DIFFERENTIAL/PLATELET
Abs Immature Granulocytes: 0.01 10*3/uL (ref 0.00–0.07)
Basophils Absolute: 0 10*3/uL (ref 0.0–0.1)
Basophils Relative: 1 %
Eosinophils Absolute: 0.1 10*3/uL (ref 0.0–0.5)
Eosinophils Relative: 2 %
HCT: 39.3 % (ref 36.0–46.0)
Hemoglobin: 12.7 g/dL (ref 12.0–15.0)
Immature Granulocytes: 0 %
Lymphocytes Relative: 20 %
Lymphs Abs: 1 10*3/uL (ref 0.7–4.0)
MCH: 30.5 pg (ref 26.0–34.0)
MCHC: 32.3 g/dL (ref 30.0–36.0)
MCV: 94.2 fL (ref 80.0–100.0)
Monocytes Absolute: 0.5 10*3/uL (ref 0.1–1.0)
Monocytes Relative: 9 %
Neutro Abs: 3.6 10*3/uL (ref 1.7–7.7)
Neutrophils Relative %: 68 %
Platelets: 300 10*3/uL (ref 150–400)
RBC: 4.17 MIL/uL (ref 3.87–5.11)
RDW: 12.9 % (ref 11.5–15.5)
WBC: 5.1 10*3/uL (ref 4.0–10.5)
nRBC: 0 % (ref 0.0–0.2)

## 2022-10-21 LAB — COMPREHENSIVE METABOLIC PANEL
ALT: 12 U/L (ref 0–44)
AST: 14 U/L — ABNORMAL LOW (ref 15–41)
Albumin: 4.7 g/dL (ref 3.5–5.0)
Alkaline Phosphatase: 41 U/L (ref 38–126)
Anion gap: 8 (ref 5–15)
BUN: 15 mg/dL (ref 6–20)
CO2: 27 mmol/L (ref 22–32)
Calcium: 9.8 mg/dL (ref 8.9–10.3)
Chloride: 104 mmol/L (ref 98–111)
Creatinine, Ser: 0.88 mg/dL (ref 0.44–1.00)
GFR, Estimated: >60 mL/min (ref >60)
Glucose, Bld: 79 mg/dL (ref 70–99)
Potassium: 3.8 mmol/L (ref 3.5–5.1)
Sodium: 139 mmol/L (ref 135–145)
Total Bilirubin: 0.6 mg/dL (ref 0.3–1.2)
Total Protein: 7.7 g/dL (ref 6.5–8.1)

## 2022-10-21 MED ORDER — METHOCARBAMOL 500 MG PO TABS: 500.0000 mg | ORAL_TABLET | Freq: Two times a day (BID) | ORAL | 0 refills | Status: AC

## 2022-10-21 NOTE — Discharge Instructions (Addendum)
It was a pleasure caring for you today in the emergency department.  Reassuringly, your CT scan was normal other than a few small gallbladder stones, although this is not likely to be the cause of your pain.  Your blood work was also normal.  You likely have a muscular strain.  This is best treated with stretching, lidocaine patches, muscle relaxers which I have given you 3-day supply of.  You may take Tylenol and Motrin as needed, alternating every 4-6 hours.  Make sure to drink plenty fluids.  Please follow up with your PCP in the next 3-5 days for a recheck. Please return to the emergency department for any worsening or worrisome symptoms.

## 2022-10-21 NOTE — ED Notes (Signed)
Patient transported to CT 

## 2022-10-21 NOTE — ED Triage Notes (Signed)
Pt arrives to ED with c/o on-going right sided flank pain x5 days.

## 2022-10-21 NOTE — ED Provider Notes (Signed)
[place] EMERGENCY DEPARTMENT AT [facility] Provider Note   CSN: [id number] Arrival date & time: 10/21/22  0710     History  Chief Complaint  Patient presents with   Flank Pain    Amanda Ayers is a 52 y.o. female with a history of left breast cancer here for 5 days of right sided lower back/flank pain.  Denies dysuria, hematuria, frequency or urgency. No fevers or chills.  No nausea, vomiting, inability to tolerate p.o. intake. The pain is started waking her up from sleep.  Notices pain is worse when she is sitting down.  Works in assembly with heavy machinery, does a lot of bending at the waist.  She was taking oxycodone 5 mg that she had leftover for meniscal surgery that helped her pain.  She tried a little bit of Tylenol but the relief did not last long.      Home Medications Prior to Admission medications   Medication Sig Start Date End Date Taking? Authorizing Provider  methocarbamol (ROBAXIN) 500 MG tablet Take 1 tablet (500 mg total) by mouth 2 (two) times daily for 3 days. 10/21/22 10/24/22 Yes Judithann Villamar, Nolberto Hanlon, DO  diclofenac (VOLTAREN) 75 MG EC tablet Take 1 tablet (75 mg total) by mouth 2 (two) times daily. 06/02/22   Lenn Sink, DPM  diclofenac (VOLTAREN) 75 MG EC tablet Take 1 tablet (75 mg total) by mouth 2 (two) times daily. 08/05/22   Lenn Sink, DPM  Semaglutide-Weight Management 1 MG/0.5ML SOAJ Inject 1 mg into the skin once a week for 28 days. 09/30/22 10/28/22  Myrlene Broker, MD  Semaglutide-Weight Management 1.7 MG/0.75ML SOAJ Inject 1.7 mg into the skin once a week for 28 days. 10/29/22 11/26/22  Myrlene Broker, MD  Semaglutide-Weight Management 2.4 MG/0.75ML SOAJ Inject 2.4 mg into the skin once a week for 28 days. 11/27/22 12/25/22  Myrlene Broker, MD  tamoxifen (NOLVADEX) 20 MG tablet Take 1 tablet (20 mg total) by mouth daily. 04/29/22   Rachel Moulds, MD  topiramate (TOPAMAX) 50 MG tablet TAKE 1 TABLET BY MOUTH EVERY  DAY 08/11/22   Myrlene Broker, MD  Vitamin D, Ergocalciferol, (DRISDOL) 1.25 MG (50000 UNIT) CAPS capsule Take 1 capsule (50,000 Units total) by mouth every 7 (seven) days. 05/11/22   Georgina Quint, MD  prochlorperazine (COMPAZINE) 10 MG tablet Take 1 tablet (10 mg total) by mouth every 6 (six) hours as needed (Nausea or vomiting). 08/17/21 10/27/21  Rachel Moulds, MD      Allergies    Prednisone    Review of Systems   Review of Systems  Constitutional:  Negative for chills and fever.  Respiratory:  Negative for shortness of breath and wheezing.   Cardiovascular:  Negative for chest pain.  Gastrointestinal:  Negative for abdominal pain, nausea and vomiting.  Genitourinary:  Positive for flank pain. Negative for decreased urine volume, hematuria and urgency.    Physical Exam Updated Vital Signs BP (!) 144/88   Pulse 65   Temp 98.2 F (36.8 C) (Oral)   Resp 16   Ht 5\' 9"  (1.753 m)   Wt 114.8 kg   SpO2 100%   BMI 37.36 kg/m  Physical Exam Constitutional:      General: She is not in acute distress.    Appearance: She is not ill-appearing or toxic-appearing.  Eyes:     Extraocular Movements: Extraocular movements intact.     Pupils: Pupils are equal, round, and reactive to light.  Cardiovascular:  Rate and Rhythm: Normal rate and regular rhythm.  Pulmonary:     Effort: Pulmonary effort is normal.     Breath sounds: Normal breath sounds. No wheezing.  Abdominal:     General: Abdomen is flat. Bowel sounds are normal.     Palpations: Abdomen is soft.  Skin:    General: Skin is warm and dry.  Neurological:     General: No focal deficit present.     Mental Status: She is alert and oriented to person, place, and time.     ED Results / Procedures / Treatments   Labs (all labs ordered are listed, but only abnormal results are displayed) Labs Reviewed  URINALYSIS, ROUTINE W REFLEX MICROSCOPIC - Abnormal; Notable for the following components:      Result Value    Ketones, ur 15 (*)    Leukocytes,Ua MODERATE (*)    Bacteria, UA RARE (*)    All other components within normal limits  COMPREHENSIVE METABOLIC PANEL - Abnormal; Notable for the following components:   AST 14 (*)    All other components within normal limits  CBC WITH DIFFERENTIAL/PLATELET    EKG None  Radiology No results found.  Procedures Procedures    Medications Ordered in ED Medications - No data to display  ED Course/ Medical Decision Making/ A&P Clinical Course as of 10/21/22 1041  Thu Oct 21, 2022  [id number] 52 year old female presenting for right-sided flank pain.  Patient is alert and oriented x 3, no acute distress, afebrile, stable to signs.  On exam patient has reproducible right sided lumbar paraspinal tenderness that is worse upon movement.  No rashes or herpes zoster.  No ecchymosis.  No right upper quadrant pain suggestive of gallbladder pathology.  No signs or symptoms of sepsis.  Stable laboratory studies.  CT scan demonstrates cholelithiasis without choledocholithiasis or cholecystitis.  UA negative for UTI.  No pyelo.  No hematuria.  Patient given recommendations for dietary changes.  At this time I still think patient symptoms are musculoskeletal in etiology.  Sent home with light stretching exercises, heating pad, and recommendations for outpatient pain management. [AG]    Clinical Course User Index [AG] Franne Forts, DO                                         Final Clinical Impression(s) / ED Diagnoses Final diagnoses:  Left flank pain  Muscle strain    Rx / DC Orders ED Discharge Orders          Ordered    methocarbamol (ROBAXIN) 500 MG tablet  2 times daily        10/21/22 1031              Dedra Matsuo, Nolberto Hanlon, DO 10/21/22 1041    Edwin Dada P, DO 10/26/22 0012

## 2022-10-22 ENCOUNTER — Inpatient Hospital Stay: Payer: BC Managed Care – PPO | Attending: Hematology and Oncology | Admitting: Hematology and Oncology

## 2022-10-27 ENCOUNTER — Other Ambulatory Visit: Payer: BC Managed Care – PPO

## 2022-10-29 ENCOUNTER — Ambulatory Visit: Payer: BC Managed Care – PPO | Admitting: Internal Medicine

## 2022-11-02 ENCOUNTER — Ambulatory Visit: Payer: BC Managed Care – PPO

## 2022-11-02 NOTE — Progress Notes (Signed)
Patient presents today to pick up custom molded foot orthotics, diagnosed with plantar fasciitis by Dr. Charlsie Merles .   Orthotics were dispensed and fit was satisfactory. Reviewed instructions for break-in and wear. Written instructions given to patient.  Patient will follow up as needed.  Addison Bailey CPed, CFo, CFm

## 2022-11-24 ENCOUNTER — Ambulatory Visit: Payer: BC Managed Care – PPO | Admitting: Internal Medicine

## 2022-11-24 ENCOUNTER — Encounter: Payer: Self-pay | Admitting: Internal Medicine

## 2022-11-24 VITALS — BP 120/82 | HR 75 | Temp 98.5°F | Ht 69.0 in | Wt 260.0 lb

## 2022-11-24 DIAGNOSIS — Z0000 Encounter for general adult medical examination without abnormal findings: Secondary | ICD-10-CM

## 2022-11-24 DIAGNOSIS — E669 Obesity, unspecified: Secondary | ICD-10-CM | POA: Diagnosis not present

## 2022-11-24 DIAGNOSIS — Z113 Encounter for screening for infections with a predominantly sexual mode of transmission: Secondary | ICD-10-CM | POA: Diagnosis not present

## 2022-11-24 DIAGNOSIS — E559 Vitamin D deficiency, unspecified: Secondary | ICD-10-CM

## 2022-11-24 DIAGNOSIS — Z1159 Encounter for screening for other viral diseases: Secondary | ICD-10-CM

## 2022-11-24 LAB — CBC
HCT: 37.8 % (ref 36.0–46.0)
Hemoglobin: 12.3 g/dL (ref 12.0–15.0)
MCHC: 32.4 g/dL (ref 30.0–36.0)
MCV: 94.5 fL (ref 78.0–100.0)
Platelets: 322 10*3/uL (ref 150.0–400.0)
RBC: 4 Mil/uL (ref 3.87–5.11)
RDW: 13.6 % (ref 11.5–15.5)
WBC: 4.7 10*3/uL (ref 4.0–10.5)

## 2022-11-24 LAB — COMPREHENSIVE METABOLIC PANEL
ALT: 11 U/L (ref 0–35)
AST: 15 U/L (ref 0–37)
Albumin: 4.3 g/dL (ref 3.5–5.2)
Alkaline Phosphatase: 42 U/L (ref 39–117)
BUN: 23 mg/dL (ref 6–23)
CO2: 29 meq/L (ref 19–32)
Calcium: 9.9 mg/dL (ref 8.4–10.5)
Chloride: 104 meq/L (ref 96–112)
Creatinine, Ser: 0.89 mg/dL (ref 0.40–1.20)
GFR: 74.3 mL/min (ref >60.00)
Glucose, Bld: 85 mg/dL (ref 70–99)
Potassium: 4.1 meq/L (ref 3.5–5.1)
Sodium: 140 meq/L (ref 135–145)
Total Bilirubin: 0.4 mg/dL (ref 0.2–1.2)
Total Protein: 6.6 g/dL (ref 6.0–8.3)

## 2022-11-24 LAB — HEMOGLOBIN A1C: Hgb A1c MFr Bld: 5.5 % (ref 4.6–6.5)

## 2022-11-24 LAB — LIPID PANEL
Cholesterol: 179 mg/dL (ref 0–200)
HDL: 66.7 mg/dL (ref >39.00)
LDL Cholesterol: 103 mg/dL — ABNORMAL HIGH (ref 0–99)
NonHDL: 112.74
Total CHOL/HDL Ratio: 3
Triglycerides: 51 mg/dL (ref 0.0–149.0)
VLDL: 10.2 mg/dL (ref 0.0–40.0)

## 2022-11-24 LAB — VITAMIN D 25 HYDROXY (VIT D DEFICIENCY, FRACTURES): VITD: 23.82 ng/mL — ABNORMAL LOW (ref 30.00–100.00)

## 2022-11-24 LAB — TSH: TSH: 1.78 u[IU]/mL (ref 0.35–5.50)

## 2022-11-24 NOTE — Progress Notes (Unsigned)
Subjective:   Patient ID: Amanda Ayers, female    DOB: [birth year], 52 y.o.   MRN: [id number]  HPI The patient is here for physical.  PMH, [facility], social history reviewed and updated  Review of Systems  Constitutional: Negative.   HENT: Negative.    Eyes: Negative.   Respiratory:  Negative for cough, chest tightness and shortness of breath.   Cardiovascular:  Negative for chest pain, palpitations and leg swelling.  Gastrointestinal:  Negative for abdominal distention, abdominal pain, constipation, diarrhea, nausea and vomiting.  Musculoskeletal: Negative.   Skin: Negative.   Neurological: Negative.   Psychiatric/Behavioral: Negative.      Objective:  Physical Exam Constitutional:      Appearance: She is well-developed.  HENT:     Head: Normocephalic and atraumatic.  Cardiovascular:     Rate and Rhythm: Normal rate and regular rhythm.  Pulmonary:     Effort: Pulmonary effort is normal. No respiratory distress.     Breath sounds: Normal breath sounds. No wheezing or rales.  Abdominal:     General: Bowel sounds are normal. There is no distension.     Palpations: Abdomen is soft.     Tenderness: There is no abdominal tenderness. There is no rebound.  Musculoskeletal:     Cervical back: Normal range of motion.  Skin:    General: Skin is warm and dry.  Neurological:     Mental Status: She is alert and oriented to person, place, and time.     Coordination: Coordination normal.     Vitals:   11/24/22 0901  BP: 120/82  Pulse: 75  Temp: 98.5 F (36.9 C)  TempSrc: Oral  SpO2: 98%  Weight: 260 lb (117.9 kg)  Height: 5\' 9"  (1.753 m)    Assessment & Plan:

## 2022-11-25 NOTE — Assessment & Plan Note (Signed)
Checking vitamin D and adjust as needed. 

## 2022-11-25 NOTE — Assessment & Plan Note (Signed)
Flu shot declines. Shingrix declines. Tetanus declines. Colonoscopy declines. Mammogram up to date, pap smear due. Counseled about sun safety and mole surveillance. Counseled about the dangers of distracted driving. Given 10 year screening recommendations.

## 2022-11-26 ENCOUNTER — Ambulatory Visit: Payer: BC Managed Care – PPO | Admitting: Podiatry

## 2022-11-26 DIAGNOSIS — L600 Ingrowing nail: Secondary | ICD-10-CM

## 2022-11-26 DIAGNOSIS — M722 Plantar fascial fibromatosis: Secondary | ICD-10-CM

## 2022-11-26 LAB — HIV ANTIBODY (ROUTINE TESTING W REFLEX): HIV 1&2 Ab, 4th Generation: NONREACTIVE

## 2022-11-26 LAB — GC/CHLAMYDIA PROBE AMP
Chlamydia trachomatis, NAA: NEGATIVE
Neisseria Gonorrhoeae by PCR: NEGATIVE

## 2022-11-26 LAB — RPR: RPR Ser Ql: NONREACTIVE

## 2022-11-26 LAB — HEPATITIS C ANTIBODY: Hepatitis C Ab: NONREACTIVE

## 2022-11-26 MED ORDER — TRIAMCINOLONE ACETONIDE 10 MG/ML IJ SUSP
10.0000 mg | Freq: Once | INTRAMUSCULAR | Status: AC
Start: 2022-11-26 — End: 2022-11-26
  Administered 2022-11-26: 10 mg via INTRA_ARTICULAR

## 2022-11-26 NOTE — Patient Instructions (Signed)

## 2022-11-28 NOTE — Progress Notes (Signed)
Subjective:   Patient ID: Amanda Ayers, female   DOB: 52 y.o.   MRN: [id number]   HPI Patient states she was on her feet quite a bit and developed a reoccurrence of Planter fasciitis on both heels even though it is improved over where it was and orthotics are doing well.  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  I then went ahead discussed ingrown toenail she wants it fixed I explained procedure risk and she signed consent form for permanent correction.  I infiltrated the right big toe 60 mg Xylocaine Marcaine mixture sterile prep done and using sterile instrumentation remove the border exposed matrix applied phenol 3 applications 30 seconds followed by alcohol lavage sterile dressing and this was done to create permanent removal of the nail border and patient tolerated it well and will be seen back to recheck with patient instructed to leave dressing on 24 hours but take it off earlier if throbbing were to occur

## 2023-03-02 ENCOUNTER — Ambulatory Visit: Payer: BC Managed Care – PPO | Admitting: Podiatry

## 2023-03-11 ENCOUNTER — Ambulatory Visit: Payer: BC Managed Care – PPO | Admitting: Podiatry

## 2023-03-20 ENCOUNTER — Encounter: Payer: Self-pay | Admitting: Internal Medicine

## 2023-03-21 NOTE — Telephone Encounter (Signed)
Schedule a visit

## 2023-05-06 ENCOUNTER — Telehealth: Payer: Self-pay

## 2023-05-09 NOTE — Telephone Encounter (Signed)
 Yes, it looks like she has upcoming visit already.

## 2023-05-11 ENCOUNTER — Encounter: Payer: Self-pay | Admitting: Internal Medicine

## 2023-05-11 NOTE — Telephone Encounter (Signed)
 I believe we have this, I will double check. She needs to be scheduled this sounds like a Surgical Marilu Favre

## 2023-05-11 NOTE — Telephone Encounter (Signed)
 I just checked this surgical clarence is located in your box. This is just an [place]

## 2023-05-24 ENCOUNTER — Encounter: Payer: Self-pay | Admitting: Internal Medicine

## 2023-05-24 ENCOUNTER — Ambulatory Visit: Admitting: Internal Medicine

## 2023-05-24 VITALS — BP 126/82 | HR 74 | Temp 98.1°F | Ht 69.0 in | Wt 263.0 lb

## 2023-05-24 DIAGNOSIS — M25561 Pain in right knee: Secondary | ICD-10-CM | POA: Diagnosis not present

## 2023-05-24 DIAGNOSIS — G8929 Other chronic pain: Secondary | ICD-10-CM

## 2023-05-24 DIAGNOSIS — Z01810 Encounter for preprocedural cardiovascular examination: Secondary | ICD-10-CM | POA: Diagnosis not present

## 2023-05-24 NOTE — Assessment & Plan Note (Signed)
 EKG done which is unchanged from prior and BP at goal. Non-smoker and METS adequate. No further testing indicated. Counseled about recovery from knee replacement as well as opioid induced constipation risk and need for stool softener after surgery.

## 2023-05-24 NOTE — Progress Notes (Signed)
   Subjective:   Patient ID: Amanda Ayers, female    DOB: May 10, 1970, 53 y.o.   MRN: [id number]  HPI The patient is a 53 YO female coming in for pre-op clearance right knee replacement. Denies chest pains or SOB. Non-smoker. Exercise limited by joint pain but otherwise no limitations.   PMH, [facility], social history reviewed and updated  Review of Systems  Constitutional: Negative.   HENT: Negative.    Eyes: Negative.   Respiratory:  Negative for cough, chest tightness and shortness of breath.   Cardiovascular:  Negative for chest pain, palpitations and leg swelling.  Gastrointestinal:  Negative for abdominal distention, abdominal pain, constipation, diarrhea, nausea and vomiting.  Musculoskeletal:  Positive for arthralgias and myalgias.  Skin: Negative.   Neurological: Negative.   Psychiatric/Behavioral: Negative.      Objective:  Physical Exam Constitutional:      Appearance: She is well-developed.  HENT:     Head: Normocephalic and atraumatic.  Cardiovascular:     Rate and Rhythm: Normal rate and regular rhythm.  Pulmonary:     Effort: Pulmonary effort is normal. No respiratory distress.     Breath sounds: Normal breath sounds. No wheezing or rales.  Abdominal:     General: Bowel sounds are normal. There is no distension.     Palpations: Abdomen is soft.     Tenderness: There is no abdominal tenderness. There is no rebound.  Musculoskeletal:        General: Tenderness present.     Cervical back: Normal range of motion.     Comments: Slow to stand and slow gait with obvious pain in the knee on standing.   Skin:    General: Skin is warm and dry.  Neurological:     Mental Status: She is alert and oriented to person, place, and time.     Coordination: Coordination normal.     Vitals:   05/24/23 0854  BP: 126/82  Pulse: 74  Temp: 98.1 F (36.7 C)  TempSrc: Oral  SpO2: 98%  Weight: 263 lb (119.3 kg)  Height: 5\' 9"  (1.753 m)  EKG: Rate 63, axis normal, interval  normal, sinus, no st or t wave changes, no significant change compared to prior 2023   Assessment & Plan:  Visit time 20 minutes in face to face communication with patient and coordination of care, additional 10 minutes spent in record review, coordination or care, ordering tests, communicating/referring to other healthcare professionals, documenting in medical records all on the same day of the visit for total time 30 minutes spent on the visit.

## 2023-05-24 NOTE — Assessment & Plan Note (Addendum)
 Here for clearance for right knee replacement. Clinically safe to proceed without further testing.

## 2023-05-25 ENCOUNTER — Other Ambulatory Visit: Payer: Self-pay | Admitting: Internal Medicine

## 2023-05-25 DIAGNOSIS — Z9889 Other specified postprocedural states: Secondary | ICD-10-CM

## 2023-05-30 ENCOUNTER — Ambulatory Visit: Payer: Self-pay | Admitting: Orthopedic Surgery

## 2023-06-27 NOTE — Progress Notes (Signed)
 COVID Vaccine Completed:  Date of COVID positive in last 90 days:  PCP - Bambi Lever, MD Cardiologist -   Medical clearance by Dr. Nicolette Barrio 05/24/23 in Epic  Chest x-ray - n/a EKG - 05/24/23 Epic Stress Test - n/a ECHO - n/a Cardiac Cath - n/a Pacemaker/ICD device last checked: n/a Spinal Cord Stimulator: n/a  Bowel Prep - no  Sleep Study - n/a CPAP -   Fasting Blood Sugar - n/a Checks Blood Sugar _____ times a day  Last dose of GLP1 agonist-  N/A GLP1 instructions:  Hold 7 days before surgery    Last dose of SGLT-2 inhibitors-  N/A SGLT-2 instructions:  Hold 3 days before surgery    Blood Thinner Instructions:  Last dose: n/a  Time: Aspirin Instructions: Last Dose:  Activity level: Can go up a flight of stairs and perform activities of daily living without stopping and without symptoms of chest pain or shortness of breath.   Anesthesia review:   Patient denies shortness of breath, fever, cough and chest pain at PAT appointment  Patient verbalized understanding of instructions that were given to them at the PAT appointment. Patient was also instructed that they will need to review over the PAT instructions again at home before surgery.

## 2023-06-27 NOTE — Patient Instructions (Signed)
 SURGICAL WAITING ROOM VISITATION  Patients having surgery or a procedure may have no more than 2 support people in the waiting area - these visitors may rotate.    Children under the age of 64 must have an adult with them who is not the patient.  Due to an increase in RSV and influenza rates and associated hospitalizations, children ages 28 and under may not visit patients in [facility] hospitals.  Visitors with respiratory illnesses are discouraged from visiting and should remain at home.  If the patient needs to stay at the hospital during part of their recovery, the visitor guidelines for inpatient rooms apply. Pre-op nurse will coordinate an appropriate time for 1 support person to accompany patient in pre-op.  This support person may not rotate.    Please refer to the [facility] website for the visitor guidelines for Inpatients (after your surgery is over and you are in a regular room).     Your procedure is scheduled on: 07/08/23   Report to [facility] Main Entrance    Report to admitting at 10:00 AM   Call this number if you have problems the morning of surgery [phone]   Do not eat food :After Midnight.   After Midnight you may have the following liquids until 9:30 AM DAY OF SURGERY  Water Non-Citrus Juices (without pulp, NO RED-Apple, White grape, White cranberry) Black Coffee (NO MILK/CREAM OR CREAMERS, sugar ok)  Clear Tea (NO MILK/CREAM OR CREAMERS, sugar ok) regular and decaf                             Plain Jell-O (NO RED)                                           Fruit ices (not with fruit pulp, NO RED)                                     Popsicles (NO RED)                                                               Sports drinks like Gatorade (NO RED)                 The day of surgery:  Drink ONE (1) Pre-Surgery Clear Ensure at 9:30 AM the morning of surgery. Drink in one sitting. Do not sip.  This drink was given to you during your hospital   pre-op appointment visit. Nothing else to drink after completing the  Pre-Surgery Clear Ensure.          If you have questions, please contact your surgeon's office.   FOLLOW BOWEL PREP AND ANY ADDITIONAL PRE OP INSTRUCTIONS YOU RECEIVED FROM YOUR SURGEON'S OFFICE!!!     Oral Hygiene is also important to reduce your risk of infection.                                    Remember -  BRUSH YOUR TEETH THE MORNING OF SURGERY WITH YOUR REGULAR TOOTHPASTE  DENTURES WILL BE REMOVED PRIOR TO SURGERY PLEASE DO NOT APPLY "Poly grip" OR ADHESIVES!!!   Stop all vitamins and herbal supplements 7 days before surgery.   Take these medicines the morning of surgery with A SIP OF WATER: Oxycodone                               You may not have any metal on your body including hair pins, jewelry, and body piercing             Do not wear make-up, lotions, powders, perfumes, or deodorant  Do not wear nail polish including gel and S&S, artificial/acrylic nails, or any other type of covering on natural nails including finger and toenails. If you have artificial nails, gel coating, etc. that needs to be removed by a nail salon please have this removed prior to surgery or surgery may need to be canceled/ delayed if the surgeon/ anesthesia feels like they are unable to be safely monitored.   Do not shave  48 hours prior to surgery.               Men may shave face and neck.   Do not bring valuables to the hospital. [facility] IS NOT             RESPONSIBLE   FOR VALUABLES.   Contacts, glasses, dentures or bridgework may not be worn into surgery.  DO NOT BRING YOUR HOME MEDICATIONS TO THE HOSPITAL. PHARMACY WILL DISPENSE MEDICATIONS LISTED ON YOUR MEDICATION LIST TO YOU DURING YOUR ADMISSION IN THE HOSPITAL!              Please read over the following fact sheets you were given: IF YOU HAVE QUESTIONS ABOUT YOUR PRE-OP INSTRUCTIONS PLEASE CALL [name]      Pre-operative 5 CHG Bath  Instructions   You can play a key role in reducing the risk of infection after surgery. Your skin needs to be as free of germs as possible. You can reduce the number of germs on your skin by washing with CHG (chlorhexidine  gluconate) soap before surgery. CHG is an antiseptic soap that kills germs and continues to kill germs even after washing.   DO NOT use if you have an allergy to chlorhexidine /CHG or antibacterial soaps. If your skin becomes reddened or irritated, stop using the CHG and notify one of our RNs at [phone].   Please shower with the CHG soap starting 4 days before surgery using the following schedule:     Please keep in mind the following:  DO NOT shave, including legs and underarms, starting the day of your first shower.   You may shave your face at any point before/day of surgery.  Place clean sheets on your bed the day you start using CHG soap. Use a clean washcloth (not used since being washed) for each shower. DO NOT sleep with pets once you start using the CHG.   CHG Shower Instructions:  If you choose to wash your hair and private area, wash first with your normal shampoo/soap.  After you use shampoo/soap, rinse your hair and body thoroughly to remove shampoo/soap residue.  Turn the water OFF and apply about 3 tablespoons (45 ml) of CHG soap to a CLEAN washcloth.  Apply CHG soap ONLY FROM YOUR NECK DOWN TO YOUR TOES (washing for 3-5 minutes)  DO NOT  use CHG soap on face, private areas, open wounds, or sores.  Pay special attention to the area where your surgery is being performed.  If you are having back surgery, having someone wash your back for you may be helpful. Wait 2 minutes after CHG soap is applied, then you may rinse off the CHG soap.  Pat dry with a clean towel  Put on clean clothes/pajamas   If you choose to wear lotion, please use ONLY the CHG-compatible lotions on the back of this paper.     Additional instructions for the day of surgery: DO NOT  APPLY any lotions, deodorants, cologne, or perfumes.   Put on clean/comfortable clothes.  Brush your teeth.  Ask your nurse before applying any prescription medications to the skin.      CHG Compatible Lotions   Aveeno Moisturizing lotion  Cetaphil Moisturizing Cream  Cetaphil Moisturizing Lotion  Clairol Herbal Essence Moisturizing Lotion, Dry Skin  Clairol Herbal Essence Moisturizing Lotion, Extra Dry Skin  Clairol Herbal Essence Moisturizing Lotion, Normal Skin  Curel Age Defying Therapeutic Moisturizing Lotion with Alpha Hydroxy  Curel Extreme Care Body Lotion  Curel Soothing Hands Moisturizing Hand Lotion  Curel Therapeutic Moisturizing Cream, Fragrance-Free  Curel Therapeutic Moisturizing Lotion, Fragrance-Free  Curel Therapeutic Moisturizing Lotion, Original Formula  Eucerin Daily Replenishing Lotion  Eucerin Dry Skin Therapy Plus Alpha Hydroxy Crme  Eucerin Dry Skin Therapy Plus Alpha Hydroxy Lotion  Eucerin Original Crme  Eucerin Original Lotion  Eucerin Plus Crme Eucerin Plus Lotion  Eucerin TriLipid Replenishing Lotion  Keri Anti-Bacterial Hand Lotion  Keri Deep Conditioning Original Lotion Dry Skin Formula Softly Scented  Keri Deep Conditioning Original Lotion, Fragrance Free Sensitive Skin Formula  Keri Lotion Fast Absorbing Fragrance Free Sensitive Skin Formula  Keri Lotion Fast Absorbing Softly Scented Dry Skin Formula  Keri Original Lotion  Keri Skin Renewal Lotion Keri Silky Smooth Lotion  Keri Silky Smooth Sensitive Skin Lotion  Nivea Body Creamy Conditioning Oil  Nivea Body Extra Enriched Lotion  Nivea Body Original Lotion  Nivea Body Sheer Moisturizing Lotion Nivea Crme  Nivea Skin Firming Lotion  NutraDerm 30 Skin Lotion  NutraDerm Skin Lotion  NutraDerm Therapeutic Skin Cream  NutraDerm Therapeutic Skin Lotion  ProShield Protective Hand Cream  Provon moisturizing lotion   PATIENT SIGNATURE_________________________________  NURSE  SIGNATURE__________________________________  ________________________________________________________________________  Benjamen Brand  An incentive spirometer is a tool that can help keep your lungs clear and active. This tool measures how well you are filling your lungs with each breath. Taking long deep breaths may help reverse or decrease the chance of developing breathing (pulmonary) problems (especially infection) following: A long period of time when you are unable to move or be active. BEFORE THE PROCEDURE  If the spirometer includes an indicator to show your best effort, your nurse or respiratory therapist will set it to a desired goal. If possible, sit up straight or lean slightly forward. Try not to slouch. Hold the incentive spirometer in an upright position. INSTRUCTIONS FOR USE  Sit on the edge of your bed if possible, or sit up as far as you can in bed or on a chair. Hold the incentive spirometer in an upright position. Breathe out normally. Place the mouthpiece in your mouth and seal your lips tightly around it. Breathe in slowly and as deeply as possible, raising the piston or the ball toward the top of the column. Hold your breath for 3-5 seconds or for as long as possible. Allow the piston or ball to  fall to the bottom of the column. Remove the mouthpiece from your mouth and breathe out normally. Rest for a few seconds and repeat Steps 1 through 7 at least 10 times every 1-2 hours when you are awake. Take your time and take a few normal breaths between deep breaths. The spirometer may include an indicator to show your best effort. Use the indicator as a goal to work toward during each repetition. After each set of 10 deep breaths, practice coughing to be sure your lungs are clear. If you have an incision (the cut made at the time of surgery), support your incision when coughing by placing a pillow or rolled up towels firmly against it. Once you are able to get out of  bed, walk around indoors and cough well. You may stop using the incentive spirometer when instructed by your caregiver.  RISKS AND COMPLICATIONS Take your time so you do not get dizzy or light-headed. If you are in pain, you may need to take or ask for pain medication before doing incentive spirometry. It is harder to take a deep breath if you are having pain. AFTER USE Rest and breathe slowly and easily. It can be helpful to keep track of a log of your progress. Your caregiver can provide you with a simple table to help with this. If you are using the spirometer at home, follow these instructions: SEEK MEDICAL CARE IF:  You are having difficultly using the spirometer. You have trouble using the spirometer as often as instructed. Your pain medication is not giving enough relief while using the spirometer. You develop fever of 100.5 F (38.1 C) or higher. SEEK IMMEDIATE MEDICAL CARE IF:  You cough up bloody sputum that had not been present before. You develop fever of 102 F (38.9 C) or greater. You develop worsening pain at or near the incision site. MAKE SURE YOU:  Understand these instructions. Will watch your condition. Will get help right away if you are not doing well or get worse. Document Released: 06/14/2006 Document Revised: 04/26/2011 Document Reviewed: 08/15/2006 [facility] Patient Information 2014 [place], [place].   ________________________________________________________________________

## 2023-06-28 ENCOUNTER — Other Ambulatory Visit: Payer: Self-pay

## 2023-06-28 ENCOUNTER — Encounter (HOSPITAL_COMMUNITY): Payer: Self-pay

## 2023-06-28 ENCOUNTER — Encounter (HOSPITAL_COMMUNITY)
Admission: RE | Admit: 2023-06-28 | Discharge: 2023-06-28 | Disposition: A | Discharge status: Home / Self Care | Source: Ambulatory Visit | Attending: Specialist | Admitting: Specialist

## 2023-06-28 VITALS — BP 107/86 | HR 77 | Temp 98.5°F | Resp 12 | Ht 67.0 in | Wt 257.0 lb

## 2023-06-28 DIAGNOSIS — Z01818 Encounter for other preprocedural examination: Secondary | ICD-10-CM | POA: Insufficient documentation

## 2023-06-28 LAB — CBC
HCT: 39.2 % (ref 36.0–46.0)
Hemoglobin: 12.4 g/dL (ref 12.0–15.0)
MCH: 30.5 pg (ref 26.0–34.0)
MCHC: 31.6 g/dL (ref 30.0–36.0)
MCV: 96.3 fL (ref 80.0–100.0)
Platelets: 290 10*3/uL (ref 150–400)
RBC: 4.07 MIL/uL (ref 3.87–5.11)
RDW: 12.1 % (ref 11.5–15.5)
WBC: 6.6 10*3/uL (ref 4.0–10.5)
nRBC: 0 % (ref 0.0–0.2)

## 2023-06-28 LAB — SURGICAL PCR SCREEN
MRSA, PCR: NEGATIVE
Staphylococcus aureus: NEGATIVE

## 2023-07-07 ENCOUNTER — Ambulatory Visit
Admission: RE | Admit: 2023-07-07 | Discharge: 2023-07-07 | Disposition: A | Discharge status: Home / Self Care | Source: Ambulatory Visit | Attending: Internal Medicine | Admitting: Internal Medicine

## 2023-07-07 DIAGNOSIS — Z9889 Other specified postprocedural states: Secondary | ICD-10-CM

## 2023-07-08 ENCOUNTER — Other Ambulatory Visit: Payer: Self-pay

## 2023-07-08 ENCOUNTER — Encounter (HOSPITAL_COMMUNITY): Payer: Self-pay | Admitting: Specialist

## 2023-07-08 ENCOUNTER — Encounter (HOSPITAL_COMMUNITY)
Admission: RE | Disposition: A | Discharge status: Home / Self Care | Payer: Self-pay | Source: Home / Self Care | Attending: Specialist

## 2023-07-08 ENCOUNTER — Ambulatory Visit (HOSPITAL_COMMUNITY)

## 2023-07-08 ENCOUNTER — Ambulatory Visit (HOSPITAL_COMMUNITY): Admitting: Certified Registered"

## 2023-07-08 ENCOUNTER — Ambulatory Visit (HOSPITAL_COMMUNITY)
Admission: RE | Admit: 2023-07-08 | Discharge: 2023-07-09 | Disposition: A | Discharge status: Home / Self Care | Attending: Specialist | Admitting: Specialist

## 2023-07-08 DIAGNOSIS — M21061 Valgus deformity, not elsewhere classified, right knee: Secondary | ICD-10-CM | POA: Insufficient documentation

## 2023-07-08 DIAGNOSIS — Z6841 Body Mass Index (BMI) 40.0 and over, adult: Secondary | ICD-10-CM | POA: Insufficient documentation

## 2023-07-08 DIAGNOSIS — E66813 Obesity, class 3: Secondary | ICD-10-CM | POA: Diagnosis not present

## 2023-07-08 DIAGNOSIS — M1711 Unilateral primary osteoarthritis, right knee: Secondary | ICD-10-CM | POA: Diagnosis present

## 2023-07-08 HISTORY — PX: TOTAL KNEE ARTHROPLASTY: SHX125

## 2023-07-08 SURGERY — ARTHROPLASTY, KNEE, TOTAL
Anesthesia: Spinal | Site: Knee | Laterality: Right

## 2023-07-08 MED ORDER — ONDANSETRON HCL 4 MG/2ML IJ SOLN
INTRAMUSCULAR | Status: AC
  Filled 2023-07-08: qty 2

## 2023-07-08 MED ORDER — ASPIRIN 81 MG PO TBEC
81.0000 mg | DELAYED_RELEASE_TABLET | Freq: Two times a day (BID) | ORAL | 1 refills | Status: AC
Start: 2023-07-08 — End: ?

## 2023-07-08 MED ORDER — CLONIDINE HCL (ANALGESIA) 100 MCG/ML EP SOLN
EPIDURAL | Status: DC | PRN
  Administered 2023-07-08: 100 ug

## 2023-07-08 MED ORDER — HYDROMORPHONE HCL 1 MG/ML IJ SOLN
0.5000 mg | INTRAMUSCULAR | Status: DC | PRN
  Administered 2023-07-08 – 2023-07-09 (×2): 1 mg via INTRAVENOUS
  Filled 2023-07-08 (×2): qty 1

## 2023-07-08 MED ORDER — FENTANYL CITRATE PF 50 MCG/ML IJ SOSY
50.0000 ug | PREFILLED_SYRINGE | INTRAMUSCULAR | Status: DC | PRN
  Administered 2023-07-08: 50 ug via INTRAVENOUS
  Filled 2023-07-08: qty 2

## 2023-07-08 MED ORDER — PROPOFOL 500 MG/50ML IV EMUL
INTRAVENOUS | Status: DC | PRN
  Administered 2023-07-08: 100 ug/kg/min via INTRAVENOUS

## 2023-07-08 MED ORDER — METHOCARBAMOL 1000 MG/10ML IJ SOLN: 500.0000 mg | Freq: Four times a day (QID) | INTRAMUSCULAR | Status: DC | PRN

## 2023-07-08 MED ORDER — PROPOFOL 1000 MG/100ML IV EMUL
INTRAVENOUS | Status: AC
  Filled 2023-07-08: qty 100

## 2023-07-08 MED ORDER — BUPIVACAINE-EPINEPHRINE (PF) 0.25% -1:200000 IJ SOLN
INTRAMUSCULAR | Status: AC
  Filled 2023-07-08: qty 30

## 2023-07-08 MED ORDER — SODIUM CHLORIDE 0.9 % IR SOLN
Status: DC | PRN
  Administered 2023-07-08: 1000 mL

## 2023-07-08 MED ORDER — POLYETHYLENE GLYCOL 3350 17 G PO PACK: 17.0000 g | PACK | Freq: Every day | ORAL | 0 refills | Status: AC

## 2023-07-08 MED ORDER — METHOCARBAMOL 500 MG PO TABS
500.0000 mg | ORAL_TABLET | Freq: Four times a day (QID) | ORAL | Status: DC | PRN
  Administered 2023-07-09 (×2): 500 mg via ORAL
  Filled 2023-07-08 (×3): qty 1

## 2023-07-08 MED ORDER — BUPIVACAINE LIPOSOME 1.3 % IJ SUSP
INTRAMUSCULAR | Status: DC | PRN
  Administered 2023-07-08: 20 mL

## 2023-07-08 MED ORDER — ORAL CARE MOUTH RINSE: 15.0000 mL | Freq: Once | OROMUCOSAL | Status: AC

## 2023-07-08 MED ORDER — VITAMIN D 50 MCG (2000 UT) PO TABS: 2000.0000 [IU] | ORAL_TABLET | Freq: Every day | ORAL | Status: DC

## 2023-07-08 MED ORDER — BISACODYL 5 MG PO TBEC: 5.0000 mg | DELAYED_RELEASE_TABLET | Freq: Every day | ORAL | Status: DC | PRN

## 2023-07-08 MED ORDER — STERILE WATER FOR IRRIGATION IR SOLN
Status: DC | PRN
  Administered 2023-07-08: 2000 mL

## 2023-07-08 MED ORDER — DIPHENHYDRAMINE HCL 12.5 MG/5ML PO ELIX: 12.5000 mg | ORAL_SOLUTION | ORAL | Status: DC | PRN

## 2023-07-08 MED ORDER — LACTATED RINGERS IV SOLN: INTRAVENOUS | Status: DC

## 2023-07-08 MED ORDER — OXYCODONE HCL 5 MG/5ML PO SOLN: 5.0000 mg | Freq: Once | ORAL | Status: AC | PRN

## 2023-07-08 MED ORDER — FENTANYL CITRATE PF 50 MCG/ML IJ SOSY
25.0000 ug | PREFILLED_SYRINGE | INTRAMUSCULAR | Status: DC | PRN
  Administered 2023-07-08: 50 ug via INTRAVENOUS
  Administered 2023-07-08 (×2): 25 ug via INTRAVENOUS

## 2023-07-08 MED ORDER — CEFAZOLIN SODIUM-DEXTROSE 2-4 GM/100ML-% IV SOLN
2.0000 g | Freq: Four times a day (QID) | INTRAVENOUS | Status: AC
  Administered 2023-07-08 – 2023-07-09 (×2): 2 g via INTRAVENOUS
  Filled 2023-07-08 (×2): qty 100

## 2023-07-08 MED ORDER — SODIUM CHLORIDE 0.9 % IR SOLN
Status: DC | PRN
  Administered 2023-07-08: 3000 mL

## 2023-07-08 MED ORDER — SODIUM CHLORIDE (PF) 0.9 % IJ SOLN
INTRAMUSCULAR | Status: AC
  Filled 2023-07-08: qty 50

## 2023-07-08 MED ORDER — OXYCODONE HCL 5 MG PO TABS
10.0000 mg | ORAL_TABLET | ORAL | Status: DC | PRN
  Administered 2023-07-09: 10 mg via ORAL
  Filled 2023-07-08: qty 2

## 2023-07-08 MED ORDER — CEFAZOLIN SODIUM-DEXTROSE 2-4 GM/100ML-% IV SOLN
2.0000 g | INTRAVENOUS | Status: AC
Start: 2023-07-08 — End: 2023-07-08
  Administered 2023-07-08: 2 g via INTRAVENOUS
  Filled 2023-07-08: qty 100

## 2023-07-08 MED ORDER — VITAMIN D 25 MCG (1000 UNIT) PO TABS
2000.0000 [IU] | ORAL_TABLET | Freq: Every day | ORAL | Status: DC
  Administered 2023-07-09: 2000 [IU] via ORAL
  Filled 2023-07-08: qty 2

## 2023-07-08 MED ORDER — MIDAZOLAM HCL 2 MG/2ML IJ SOLN
1.0000 mg | INTRAMUSCULAR | Status: DC | PRN
  Administered 2023-07-08: 1 mg via INTRAVENOUS
  Filled 2023-07-08: qty 2

## 2023-07-08 MED ORDER — PHENOL 1.4 % MT LIQD: 1.0000 | OROMUCOSAL | Status: DC | PRN

## 2023-07-08 MED ORDER — ONDANSETRON HCL 4 MG PO TABS: 4.0000 mg | ORAL_TABLET | Freq: Four times a day (QID) | ORAL | Status: DC | PRN

## 2023-07-08 MED ORDER — ACETAMINOPHEN 500 MG PO TABS
1000.0000 mg | ORAL_TABLET | Freq: Four times a day (QID) | ORAL | Status: AC
  Administered 2023-07-08 – 2023-07-09 (×4): 1000 mg via ORAL
  Filled 2023-07-08 (×4): qty 2

## 2023-07-08 MED ORDER — PRONTOSAN WOUND IRRIGATION OPTIME
TOPICAL | Status: DC | PRN
  Administered 2023-07-08: 1 via TOPICAL

## 2023-07-08 MED ORDER — ALUM & MAG HYDROXIDE-SIMETH 200-200-20 MG/5ML PO SUSP: 30.0000 mL | ORAL | Status: DC | PRN

## 2023-07-08 MED ORDER — POLYETHYLENE GLYCOL 3350 17 G PO PACK: 17.0000 g | PACK | Freq: Every day | ORAL | Status: DC | PRN

## 2023-07-08 MED ORDER — PHENYLEPHRINE 80 MCG/ML (10ML) SYRINGE FOR IV PUSH (FOR BLOOD PRESSURE SUPPORT)
PREFILLED_SYRINGE | INTRAVENOUS | Status: DC | PRN
  Administered 2023-07-08: 160 ug via INTRAVENOUS
  Administered 2023-07-08: 80 ug via INTRAVENOUS
  Administered 2023-07-08: 160 ug via INTRAVENOUS

## 2023-07-08 MED ORDER — ACETAMINOPHEN 10 MG/ML IV SOLN: 1000.0000 mg | Freq: Once | INTRAVENOUS | Status: DC | PRN

## 2023-07-08 MED ORDER — OXYCODONE HCL 5 MG PO TABS
5.0000 mg | ORAL_TABLET | Freq: Once | ORAL | Status: AC | PRN
  Administered 2023-07-08: 5 mg via ORAL

## 2023-07-08 MED ORDER — METOCLOPRAMIDE HCL 5 MG/ML IJ SOLN: 5.0000 mg | Freq: Three times a day (TID) | INTRAMUSCULAR | Status: DC | PRN

## 2023-07-08 MED ORDER — BUPIVACAINE-EPINEPHRINE 0.25% -1:200000 IJ SOLN
INTRAMUSCULAR | Status: DC | PRN
  Administered 2023-07-08: 20 mL

## 2023-07-08 MED ORDER — OXYCODONE HCL 5 MG PO TABS
ORAL_TABLET | ORAL | Status: AC
  Filled 2023-07-08: qty 1

## 2023-07-08 MED ORDER — MAGNESIUM CITRATE PO SOLN: 1.0000 | Freq: Once | ORAL | Status: DC | PRN

## 2023-07-08 MED ORDER — FENTANYL CITRATE PF 50 MCG/ML IJ SOSY
PREFILLED_SYRINGE | INTRAMUSCULAR | Status: AC
  Filled 2023-07-08: qty 1

## 2023-07-08 MED ORDER — OXYCODONE HCL 5 MG PO TABS
5.0000 mg | ORAL_TABLET | ORAL | Status: DC | PRN
Start: 2023-07-08 — End: 2023-07-09
  Administered 2023-07-08: 5 mg via ORAL
  Administered 2023-07-09: 10 mg via ORAL
  Filled 2023-07-08: qty 2
  Filled 2023-07-08: qty 1

## 2023-07-08 MED ORDER — CHLORHEXIDINE GLUCONATE 0.12 % MT SOLN
15.0000 mL | Freq: Once | OROMUCOSAL | Status: AC
  Administered 2023-07-08: 15 mL via OROMUCOSAL

## 2023-07-08 MED ORDER — ACETAMINOPHEN 10 MG/ML IV SOLN
1000.0000 mg | INTRAVENOUS | Status: AC
  Administered 2023-07-08: 1000 mg via INTRAVENOUS
  Filled 2023-07-08: qty 100

## 2023-07-08 MED ORDER — ONDANSETRON HCL 4 MG/2ML IJ SOLN
4.0000 mg | Freq: Four times a day (QID) | INTRAMUSCULAR | Status: DC | PRN
  Administered 2023-07-08 – 2023-07-09 (×2): 4 mg via INTRAVENOUS
  Filled 2023-07-08 (×2): qty 2

## 2023-07-08 MED ORDER — KCL IN DEXTROSE-NACL 20-5-0.45 MEQ/L-%-% IV SOLN
INTRAVENOUS | Status: DC
  Filled 2023-07-08 (×2): qty 1000

## 2023-07-08 MED ORDER — DOCUSATE SODIUM 100 MG PO CAPS: 100.0000 mg | ORAL_CAPSULE | Freq: Two times a day (BID) | ORAL | 2 refills | Status: AC

## 2023-07-08 MED ORDER — OXYCODONE HCL 10 MG PO TABS: 10.0000 mg | ORAL_TABLET | ORAL | 0 refills | Status: AC | PRN

## 2023-07-08 MED ORDER — BUPIVACAINE LIPOSOME 1.3 % IJ SUSP
INTRAMUSCULAR | Status: AC
  Filled 2023-07-08: qty 20

## 2023-07-08 MED ORDER — METHOCARBAMOL 500 MG PO TABS: 500.0000 mg | ORAL_TABLET | Freq: Three times a day (TID) | ORAL | 1 refills | Status: AC | PRN

## 2023-07-08 MED ORDER — DROPERIDOL 2.5 MG/ML IJ SOLN: 0.6250 mg | Freq: Once | INTRAMUSCULAR | Status: DC | PRN

## 2023-07-08 MED ORDER — MENTHOL 3 MG MT LOZG: 1.0000 | LOZENGE | OROMUCOSAL | Status: DC | PRN

## 2023-07-08 MED ORDER — BUPIVACAINE IN DEXTROSE 0.75-8.25 % IT SOLN
INTRATHECAL | Status: DC | PRN
  Administered 2023-07-08: 1.6 mL via INTRATHECAL

## 2023-07-08 MED ORDER — ONDANSETRON HCL 4 MG/2ML IJ SOLN
INTRAMUSCULAR | Status: DC | PRN
  Administered 2023-07-08: 4 mg via INTRAVENOUS

## 2023-07-08 MED ORDER — ASPIRIN 81 MG PO CHEW
81.0000 mg | CHEWABLE_TABLET | Freq: Two times a day (BID) | ORAL | Status: DC
  Administered 2023-07-09: 81 mg via ORAL
  Filled 2023-07-08: qty 1

## 2023-07-08 MED ORDER — PROPOFOL 10 MG/ML IV BOLUS
INTRAVENOUS | Status: DC | PRN
  Administered 2023-07-08 (×2): 20 mg via INTRAVENOUS

## 2023-07-08 MED ORDER — METOCLOPRAMIDE HCL 5 MG PO TABS: 5.0000 mg | ORAL_TABLET | Freq: Three times a day (TID) | ORAL | Status: DC | PRN

## 2023-07-08 MED ORDER — RISAQUAD PO CAPS
1.0000 | ORAL_CAPSULE | Freq: Every day | ORAL | Status: DC
  Administered 2023-07-08 – 2023-07-09 (×2): 1 via ORAL
  Filled 2023-07-08 (×2): qty 1

## 2023-07-08 MED ORDER — 0.9 % SODIUM CHLORIDE (POUR BTL) OPTIME
TOPICAL | Status: DC | PRN
  Administered 2023-07-08: 1000 mL

## 2023-07-08 MED ORDER — ROPIVACAINE HCL 5 MG/ML IJ SOLN
INTRAMUSCULAR | Status: DC | PRN
  Administered 2023-07-08: 20 mL via PERINEURAL

## 2023-07-08 MED ORDER — PHENYLEPHRINE 80 MCG/ML (10ML) SYRINGE FOR IV PUSH (FOR BLOOD PRESSURE SUPPORT)
PREFILLED_SYRINGE | INTRAVENOUS | Status: AC
  Filled 2023-07-08: qty 10

## 2023-07-08 MED ORDER — DEXAMETHASONE SODIUM PHOSPHATE 10 MG/ML IJ SOLN
INTRAMUSCULAR | Status: DC | PRN
  Administered 2023-07-08: 10 mg via INTRAVENOUS

## 2023-07-08 MED ORDER — DOCUSATE SODIUM 100 MG PO CAPS
100.0000 mg | ORAL_CAPSULE | Freq: Two times a day (BID) | ORAL | Status: DC
  Administered 2023-07-08 – 2023-07-09 (×2): 100 mg via ORAL
  Filled 2023-07-08 (×2): qty 1

## 2023-07-08 MED ORDER — TRANEXAMIC ACID-NACL 1000-0.7 MG/100ML-% IV SOLN
1000.0000 mg | INTRAVENOUS | Status: AC
  Administered 2023-07-08: 1000 mg via INTRAVENOUS
  Filled 2023-07-08: qty 100

## 2023-07-08 MED ORDER — SODIUM CHLORIDE 0.9% FLUSH
INTRAVENOUS | Status: DC | PRN
Start: 2023-07-08 — End: 2023-07-08
  Administered 2023-07-08: 40 mL

## 2023-07-08 SURGICAL SUPPLY — 62 items
ATTUNE PS FEM RT SZ 6 CEM KNEE (Femur) IMPLANT
ATTUNE PSRP INSR SZ6 6 KNEE (Insert) IMPLANT
BAG COUNTER SPONGE SURGICOUNT (BAG) IMPLANT
BAG ZIPLOCK 12X15 (MISCELLANEOUS) IMPLANT
BASE TIBIA ATTUNE KNEE SYS SZ6 (Knees) IMPLANT
BLADE SAW SGTL 11.0X1.19X90.0M (BLADE) ×1 IMPLANT
BLADE SAW SGTL 13.0X1.19X90.0M (BLADE) ×1 IMPLANT
BLADE SURG SZ10 CARB STEEL (BLADE) ×2 IMPLANT
BNDG ELASTIC 4INX 5YD STR LF (GAUZE/BANDAGES/DRESSINGS) ×1 IMPLANT
BNDG ELASTIC 6INX 5YD STR LF (GAUZE/BANDAGES/DRESSINGS) ×1 IMPLANT
BNDG ELASTIC 6X10 VLCR STRL LF (GAUZE/BANDAGES/DRESSINGS) IMPLANT
BOWL SMART MIX CTS (DISPOSABLE) ×1 IMPLANT
CEMENT HV SMART SET (Cement) ×2 IMPLANT
COVER SURGICAL LIGHT HANDLE (MISCELLANEOUS) ×1 IMPLANT
CUFF TRNQT CYL 34X4.125X (TOURNIQUET CUFF) ×1 IMPLANT
DRAPE INCISE IOBAN 66X45 STRL (DRAPES) IMPLANT
DRAPE SHEET LG 3/4 BI-LAMINATE (DRAPES) ×1 IMPLANT
DRAPE SURG ORHT 6 SPLT 77X108 (DRAPES) ×2 IMPLANT
DRAPE TOP 10253 STERILE (DRAPES) ×1 IMPLANT
DRAPE U-SHAPE 47X51 STRL (DRAPES) ×1 IMPLANT
DRESSING AQUACEL AG SP 3.5X10 (GAUZE/BANDAGES/DRESSINGS) IMPLANT
DRSG AQUACEL AG ADV 3.5X10 (GAUZE/BANDAGES/DRESSINGS) ×1 IMPLANT
DURAPREP 26ML APPLICATOR (WOUND CARE) ×1 IMPLANT
ELECT BLADE TIP CTD 4 INCH (ELECTRODE) ×1 IMPLANT
ELECT PENCIL ROCKER SW 15FT (MISCELLANEOUS) ×1 IMPLANT
ELECT REM PT RETURN 15FT ADLT (MISCELLANEOUS) ×1 IMPLANT
EVACUATOR 1/8 PVC DRAIN (DRAIN) IMPLANT
GLOVE BIO SURGEON STRL SZ7 (GLOVE) ×1 IMPLANT
GLOVE BIOGEL PI IND STRL 7.0 (GLOVE) ×1 IMPLANT
GLOVE BIOGEL PI IND STRL 8 (GLOVE) ×1 IMPLANT
GLOVE SURG SS PI 8.0 STRL IVOR (GLOVE) ×2 IMPLANT
GOWN STRL REUS W/ TWL XL LVL3 (GOWN DISPOSABLE) ×2 IMPLANT
HEMOSTAT SPONGE AVITENE ULTRA (HEMOSTASIS) IMPLANT
HOLDER FOLEY CATH W/STRAP (MISCELLANEOUS) ×1 IMPLANT
IMMOBILIZER KNEE 20 (SOFTGOODS) ×1 IMPLANT
IMMOBILIZER KNEE 20 THIGH 36 (SOFTGOODS) ×1 IMPLANT
KIT TURNOVER KIT A (KITS) IMPLANT
MANIFOLD NEPTUNE II (INSTRUMENTS) ×1 IMPLANT
NS IRRIG 1000ML POUR BTL (IV SOLUTION) IMPLANT
PACK TOTAL KNEE CUSTOM (KITS) ×1 IMPLANT
PATELLA MEDIAL ATTUN 35MM KNEE (Knees) IMPLANT
PIN STEINMAN FIXATION KNEE (PIN) IMPLANT
PROTECTOR NERVE ULNAR (MISCELLANEOUS) ×1 IMPLANT
SAW OSC TIP CART 19.5X105X1.3 (SAW) IMPLANT
SEALER BIPOLAR AQUA 6.0 (INSTRUMENTS) IMPLANT
SET HNDPC FAN SPRY TIP SCT (DISPOSABLE) ×1 IMPLANT
SOLUTION PRONTOSAN WOUND 350ML (IRRIGATION / IRRIGATOR) ×1 IMPLANT
SPIKE FLUID TRANSFER (MISCELLANEOUS) ×1 IMPLANT
STAPLER VISISTAT (STAPLE) IMPLANT
STRIP CLOSURE SKIN 1/2X4 (GAUZE/BANDAGES/DRESSINGS) IMPLANT
SUT BONE WAX W31G (SUTURE) ×1 IMPLANT
SUT MNCRL AB 4-0 PS2 18 (SUTURE) IMPLANT
SUT VIC AB 0 CT1 36 (SUTURE) ×1 IMPLANT
SUT VIC AB 1 CT1 27XBRD ANTBC (SUTURE) ×3 IMPLANT
SUT VIC AB 2-0 CT1 TAPERPNT 27 (SUTURE) ×3 IMPLANT
SUTURE STRATFX 0 PDS 27 VIOLET (SUTURE) ×1 IMPLANT
TAPE STRIPS DRAPE STRL (GAUZE/BANDAGES/DRESSINGS) IMPLANT
TRAY FOLEY MTR SLVR 16FR STAT (SET/KITS/TRAYS/PACK) ×1 IMPLANT
TUBE SUCTION HIGH CAP CLEAR NV (SUCTIONS) ×1 IMPLANT
WATER STERILE IRR 1000ML POUR (IV SOLUTION) ×1 IMPLANT
WIPE CHG 2% PREP (PERSONAL CARE ITEMS) ×1 IMPLANT
WRAP KNEE MAXI GEL POST OP (GAUZE/BANDAGES/DRESSINGS) ×1 IMPLANT

## 2023-07-08 NOTE — Anesthesia Procedure Notes (Addendum)
 Spinal  Patient location during procedure: OR Start time: 07/08/2023 1:10 PM End time: 07/08/2023 1:15 PM Reason for block: surgical anesthesia Staffing Performed: anesthesiologist  Anesthesiologist: Leslye Rast, MD Performed by: Leslye Rast, MD Authorized by: Leslye Rast, MD   Preanesthetic Checklist Completed: patient identified, IV checked, site marked, risks and benefits discussed, surgical consent, monitors and equipment checked, pre-op evaluation and timeout performed Spinal Block Patient position: sitting Prep: DuraPrep Patient monitoring: heart rate, cardiac monitor, continuous pulse ox and blood pressure Approach: midline Location: L3-4 Injection technique: single-shot Needle Needle type: Quincke  Needle gauge: 22 G Needle length: 9 cm Assessment Sensory level: T4 Events: CSF return and second provider

## 2023-07-08 NOTE — Brief Op Note (Signed)
 07/08/2023  12:51 PM  PATIENT:  Amanda Ayers  53 y.o. female  PRE-OPERATIVE DIAGNOSIS:  Right knee degenerative joint disease  POST-OPERATIVE DIAGNOSIS:  Right knee degenerative joint disease  PROCEDURE:  Procedure(s): ARTHROPLASTY, KNEE, TOTAL (Right) The aquamantis was utilized for this case to help facilitate better hemostasis as patient was felt to be at increased risk of bleeding because of obesity.   SURGEON:  Surgeons and Role:    * Orvan Blanch, MD - Primary  PHYSICIAN ASSISTANT:   ASSISTANTS: Bissell   ANESTHESIA:   spinal  EBL:  50   BLOOD ADMINISTERED:none  DRAINS: none   LOCAL MEDICATIONS USED:  MARCAINE      SPECIMEN:  No Specimen    COUNTS:  YES  TOURNIQUET:                                       DICTATION: .Other Dictation: Dictation Number [id number]  PLAN OF CARE: Admit for overnight observation  PATIENT DISPOSITION:  PACU - hemodynamically stable.   Delay start of Pharmacological VTE agent (>24hrs) due to surgical blood loss or risk of bleeding: no

## 2023-07-08 NOTE — Anesthesia Procedure Notes (Signed)
 Anesthesia Regional Block: Adductor canal block   Pre-Anesthetic Checklist: , timeout performed,  Correct Patient, Correct Site, Correct Laterality,  Correct Procedure, Correct Position, site marked,  Risks and benefits discussed,  Surgical consent,  Pre-op evaluation,  At surgeon's request and post-op pain management  Laterality: Right  Prep: Maximum Sterile Barrier Precautions used, chloraprep       Needles:  Injection technique: Single-shot  Needle Type: Echogenic Needle      Needle Gauge: 20     Additional Needles:   Procedures:,,,, ultrasound used (permanent image in chart),,    Narrative:  Start time: 07/08/2023 12:55 PM End time: 07/08/2023 12:58 PM Injection made incrementally with aspirations every 5 mL.  Performed by: Personally  Anesthesiologist: Leslye Rast, MD

## 2023-07-08 NOTE — Discharge Instructions (Signed)

## 2023-07-08 NOTE — Anesthesia Postprocedure Evaluation (Signed)
 Anesthesia Post Note  Patient: Amanda Ayers  Procedure(s) Performed: ARTHROPLASTY, KNEE, TOTAL (Right: Knee)     Patient location during evaluation: PACU Anesthesia Type: Spinal Level of consciousness: awake and alert Pain management: pain level controlled Vital Signs Assessment: post-procedure vital signs reviewed and stable Respiratory status: spontaneous breathing, nonlabored ventilation, respiratory function stable and patient connected to nasal cannula oxygen Cardiovascular status: blood pressure returned to baseline and stable Postop Assessment: no apparent nausea or vomiting Anesthetic complications: no   No notable events documented.  Last Vitals:  Vitals:   07/08/23 1558 07/08/23 1600  BP: (!) 120/90 130/71  Pulse: 81 67  Resp: 18 18  Temp: (!) 36.1 C   SpO2: 100% 100%    Last Pain:  Vitals:   07/08/23 1621  TempSrc:   PainSc: 4                  Lethaniel Rave

## 2023-07-08 NOTE — Anesthesia Preprocedure Evaluation (Signed)
 Anesthesia Evaluation  Patient identified by MRN, date of birth, ID band Patient awake    Reviewed: Allergy & Precautions, NPO status , Patient's Chart, lab work & pertinent test results, reviewed documented beta blocker date and time   History of Anesthesia Complications Negative for: history of anesthetic complications  Airway Mallampati: II  TM Distance: >3 FB     Dental no notable dental hx.    Pulmonary neg COPD   breath sounds clear to auscultation       Cardiovascular (-) hypertension(-) angina (-) CAD and (-) Past MI  Rhythm:Regular Rate:Normal     Neuro/Psych neg Seizures    GI/Hepatic hiatal hernia,neg GERD  ,,(+) neg Cirrhosis        Endo/Other    Class 3 obesity  Renal/GU Renal disease     Musculoskeletal  (+) Arthritis , Osteoarthritis,    Abdominal   Peds  Hematology   Anesthesia Other Findings   Reproductive/Obstetrics                              Anesthesia Physical Anesthesia Plan  ASA: 2  Anesthesia Plan: Spinal   Post-op Pain Management: Regional block*   Induction: Intravenous  PONV Risk Score and Plan: 2 and Ondansetron  and Propofol  infusion  Airway Management Planned: Natural Airway and Simple Face Mask  Additional Equipment:   Intra-op Plan:   Post-operative Plan:   Informed Consent: I have reviewed the patients History and Physical, chart, labs and discussed the procedure including the risks, benefits and alternatives for the proposed anesthesia with the patient or authorized representative who has indicated his/her understanding and acceptance.     Dental advisory given  Plan Discussed with: CRNA  Anesthesia Plan Comments:          Anesthesia Quick Evaluation

## 2023-07-08 NOTE — Transfer of Care (Signed)
 Immediate Anesthesia Transfer of Care Note  Patient: Amanda Ayers  Procedure(s) Performed: ARTHROPLASTY, KNEE, TOTAL (Right: Knee)  Patient Location: PACU  Anesthesia Type:Spinal  Level of Consciousness: awake, alert , and oriented  Airway & Oxygen Therapy: Patient Spontanous Breathing  Post-op Assessment: Report given to RN and Post -op Vital signs reviewed and stable  Post vital signs: Reviewed and stable  Last Vitals:  Vitals Value Taken Time  BP 120/90 07/08/23 1558  Temp    Pulse 63 07/08/23 1600  Resp 14 07/08/23 1600  SpO2 98 % 07/08/23 1600  Vitals shown include unfiled device data.  Last Pain:  Vitals:   07/08/23 1110  TempSrc:   PainSc: 0-No pain         Complications: No notable events documented.

## 2023-07-08 NOTE — H&P (Signed)
 Amanda Ayers is an 53 y.o. female.   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/14/2021   Personal history of chemotherapy    3 treatments   Personal history of radiation therapy    30 days   Routine general medical examination at a health care facility 06/05/2021    Past Surgical History:  Procedure Laterality Date   BREAST LUMPECTOMY WITH RADIOACTIVE SEED AND SENTINEL LYMPH NODE BIOPSY Left 07/21/2021   Procedure: LEFT BREAST LUMPECTOMY WITH RADIOACTIVE SEED AND SENTINEL LYMPH NODE BIOPSY;  Surgeon: Lockie Rima, MD;  Location: [facility];  Service: General;  Laterality: Left;    Family History  Problem Relation Age of Onset   Multiple myeloma Maternal Uncle        d. 36s   Uterine cancer Maternal Grandmother        dx after age 47   Social History:  reports that she has never smoked. She has never been exposed to tobacco smoke. She has never used smokeless tobacco. She reports that she does not currently use alcohol after a past usage of about 1.0 standard drink of alcohol per week. She reports that she does not use drugs.  Allergies:  Allergies  Allergen Reactions   Prednisone  Swelling    Medications Prior to Admission  Medication Sig Dispense Refill   Cholecalciferol (VITAMIN D ) 50 MCG (2000 UT) tablet Take 2,000 Units by mouth daily.     oxyCODONE  (OXY IR/ROXICODONE ) 5 MG immediate release tablet Take 5 mg by mouth every 4 (four) hours as needed for severe pain (pain score 7-10).      No results found for this or any previous visit (from the past 48 hours). MM 3D DIAGNOSTIC MAMMOGRAM BILATERAL BREAST Result Date:  07/07/2023 CLINICAL DATA:  53 year old woman status post LEFT lumpectomy on 07/21/2021. EXAM: DIGITAL DIAGNOSTIC BILATERAL MAMMOGRAM WITH TOMOSYNTHESIS AND CAD TECHNIQUE: Bilateral digital diagnostic mammography and breast tomosynthesis was performed. The images were evaluated with computer-aided detection. COMPARISON:  Previous exam(s). ACR Breast Density Category a: The breasts are almost entirely fatty. FINDINGS: RIGHT: Mammogram: No suspicious mass, distortion, or microcalcifications are identified to suggest presence of malignancy. LEFT: Mammogram: Postlumpectomy changes are seen in the inner LEFT breast. No suspicious mass, distortion, or microcalcifications are identified to suggest presence of malignancy. IMPRESSION: 1. Post lumpectomy changes of the inner LEFT breast. 2. No findings suspicious for RIGHT or LEFT breast malignancy. RECOMMENDATION: BILATERAL diagnostic mammogram in 1 year. I have discussed the findings and recommendations with the patient. If applicable, a reminder letter will be sent to the patient regarding the next appointment. BI-RADS CATEGORY  2: Benign. Electronically Signed   By: Elester Grim M.D.   On: 07/07/2023 08:35    Review of Systems  Musculoskeletal:  Positive for joint swelling and myalgias.  All other systems reviewed and are negative.   Blood pressure 130/65, pulse 78, temperature 97.9 F (36.6 C), temperature source Oral, resp. rate (!) 22, height 5\' 7"  (1.702 m), weight 116.6 kg, SpO2 100%. Physical Exam HENT:     Head: Normocephalic.  Eyes:     Pupils: Pupils are equal, round, and reactive to light.  Cardiovascular:     Rate and Rhythm: Normal  rate.  Pulmonary:     Effort: Pulmonary effort is normal.  Abdominal:     General: Abdomen is flat.  Musculoskeletal:        General: Swelling and tenderness present.     Comments: Right knee tender in the medial joint line patellofemoral pain compression range 0-1 10.  No instability mild effusion. Hip and  ankle exams unremarkable.  Skin:    General: Skin is warm and dry.  Neurological:     General: No focal deficit present.     Mental Status: She is alert.  Psychiatric:        Mood and Affect: Mood normal.   X-rays of the right knee demonstrate end-stage osteoarthrosis medial compartment of the right knee with varus deformity  Assessment/Plan 1.  Symptomatic end-stage osteoarthrosis varus deformity of the right knee refractory to conservative treatment  Plan:  1.  Proceed with a right total knee arthroplasty.  Risk and benefits discussed including bleeding, infection, damage to the structures.  No changes in symptoms or symptoms DVT PE anesthetic complication etc.  Loel Ring, MD 07/08/2023, 12:47 PM

## 2023-07-09 DIAGNOSIS — M1711 Unilateral primary osteoarthritis, right knee: Secondary | ICD-10-CM | POA: Diagnosis not present

## 2023-07-09 LAB — BASIC METABOLIC PANEL WITH GFR
Anion gap: 10 (ref 5–15)
BUN: 15 mg/dL (ref 6–20)
CO2: 22 mmol/L (ref 22–32)
Calcium: 9.2 mg/dL (ref 8.9–10.3)
Chloride: 103 mmol/L (ref 98–111)
Creatinine, Ser: 0.9 mg/dL (ref 0.44–1.00)
GFR, Estimated: >60 mL/min (ref >60)
Glucose, Bld: 140 mg/dL — ABNORMAL HIGH (ref 70–99)
Potassium: 4.1 mmol/L (ref 3.5–5.1)
Sodium: 135 mmol/L (ref 135–145)

## 2023-07-09 NOTE — Progress Notes (Signed)
 Physical Therapy Treatment Patient Details Name: Amanda Ayers MRN: [id number] DOB: [birth year] Today's Date: 07/09/2023   History of Present Illness Pt is a 53 year old female s/p R TKA on 07/08/23    PT Comments  Pt assisted to bathroom and then ambulated in hallway.  Pt also practiced one step.  Pt had no further questions and feels ready for d/c home today.     If plan is discharge home, recommend the following: Assistance with cooking/housework;Help with stairs or ramp for entrance;Assist for transportation   Can travel by private vehicle        Equipment Recommendations  None recommended by PT    Recommendations for Other Services       Precautions / Restrictions Precautions Precautions: Fall;Knee Precaution/Restrictions Comments: able to perform SLR Required Braces or Orthoses: Knee Immobilizer - Right Restrictions Weight Bearing Restrictions Per Provider Order: No Other Position/Activity Restrictions: WBAT     Mobility  Bed Mobility Overal bed mobility: Needs Assistance Bed Mobility: Supine to Sit     Supine to sit: Contact guard, HOB elevated     General bed mobility comments: pt in recliner    Transfers Overall transfer level: Needs assistance Equipment used: Rolling walker (2 wheels) Transfers: Sit to/from Stand Sit to Stand: Contact guard assist           General transfer comment: verbal cues for UE and LE positioning    Ambulation/Gait Ambulation/Gait assistance: Contact guard assist Gait Distance (Feet): 120 Feet Assistive device: Rolling walker (2 wheels) Gait Pattern/deviations: Step-to pattern, Decreased stance time - right, Antalgic Gait velocity: decr     General Gait Details: verbal cues for sequence, RW positioning, posture, step length   Stairs Stairs: Yes Stairs assistance: Contact guard assist Stair Management: Step to pattern, Forwards, With walker Number of Stairs: 1 General stair comments: verbal cues for sequence,  RW positioning; pt performed once and reports understanding   Wheelchair Mobility     Tilt Bed    Modified Rankin (Stroke Patients Only)       Balance                                            Communication Communication Communication: No apparent difficulties  Cognition Arousal: Alert Behavior During Therapy: WFL for tasks assessed/performed   PT - Cognitive impairments: No apparent impairments                         Following commands: Intact      Cueing    Exercises     General Comments        Pertinent Vitals/Pain Pain Assessment Pain Assessment: 0-10 Pain Score: 6  Pain Location: right knee Pain Descriptors / Indicators: Sore, Aching Pain Intervention(s): Repositioned, Patient requesting pain meds-RN notified, Monitored during session, Ice applied    Home Living Family/patient expects to be discharged to:: Private residence Living Arrangements: Spouse/significant other   Type of Home: House Home Access: Stairs to enter   Secretary/administrator of Steps: 1   Home Layout: One level Home Equipment: Agricultural consultant (2 wheels)      Prior Function            PT Goals (current goals can now be found in the care plan section) Acute Rehab PT Goals PT Goal Formulation: With patient Time For Goal Achievement: 07/16/23 Potential  to Achieve Goals: Good Progress towards PT goals: Progressing toward goals    Frequency    7X/week      PT Plan      Co-evaluation              AM-PAC PT "6 Clicks" Mobility   Outcome Measure  Help needed turning from your back to your side while in a flat bed without using bedrails?: A Little Help needed moving from lying on your back to sitting on the side of a flat bed without using bedrails?: A Little Help needed moving to and from a bed to a chair (including a wheelchair)?: A Little Help needed standing up from a chair using your arms (e.g., wheelchair or bedside chair)?: A  Little Help needed to walk in hospital room?: A Little Help needed climbing 3-5 steps with a railing? : A Little 6 Click Score: 18    End of Session Equipment Utilized During Treatment: Gait belt Activity Tolerance: Patient tolerated treatment well Patient left: in chair;with call bell/phone within reach;with family/visitor present;with chair alarm set Nurse Communication: Mobility status PT Visit Diagnosis: Difficulty in walking, not elsewhere classified (R26.2)     Time: 1610-9604 PT Time Calculation (min) (ACUTE ONLY): 18 min  Charges:    $Gait Training: 8-22 mins PT General Charges $$ ACUTE PT VISIT: 1 Visit                     Amanda Ayers, DPT Physical Therapist Acute Rehabilitation Services Office: [phone]    Amanda Ayers 07/09/2023, 3:00 PM

## 2023-07-09 NOTE — Op Note (Unsigned)
 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  PROCEDURES PERFORMED:  Right total knee arthroplasty utilizing a DePuy Attune rotating platform, 6 femur, 6 tibia, 6 mm insert, 32 patella.  ANESTHESIA:  Spinal.  ASSISTANT:  Amy Kansky, PA-C.  HISTORY:  A 53 year old with end-stage osteoarthrosis, lateral compartment bone-on-bone, refractory to conservative treatment, failing conservative treatment, indicated for replacement of the degenerated joint.  Risks and benefits were discussed  including bleeding, infection, damage to neurovascular structures, no change in symptoms, worsening symptoms, DVT, PE, anesthetic complications, etc.  DESCRIPTION OF PROCEDURE:  The patient was placed in the supine position after he had adequate spinal anesthesia and 2 g of Kefzol .  The right lower extremity was prepped and draped and exsanguinated in the usual sterile fashion.  Thigh tourniquet  inflated to 255 mmHg.  A midline incision was then made over the knee.  Full-thickness flaps were developed.  Medial parapatellar arthrotomy was performed.  Patella gently everted.  Knee was flexed.  Osteoarthrosis bone-on-bone was noted in the lateral  compartment.  In the lateral facet of the patella.  Removed the remnants of medial and lateral meniscus.  A notch was placed above the femoral notch at the starting hole for the femoral drill.  This was drilled in line with the femur, irrigated,  T-handled, intramedullary guide 5-degree right, 9 off the distal femur.  This was pinned to perform a distal femoral cut.  I then sized the femur off the anterior  cortex to be a 6, 3 degrees of external rotation in line with the transepicondylar axis.   This was then pinned.  I placed a distal femoral cutting block.  I performed anterior, posterior, and chamfer cuts.  We had a positive piano sign.  We did not notch the femur.  Subluxed the tibia and remnants of the menisci were removed.  The low side  was posterolaterally.  The deepest defect was posterolateral. External alignment guide, biceps and tibiotalar joint parallel to the shaft, 3-degree slope pinned to perform a tibial cut.  Extension block was trialed at a 6, full extension and stable.   We flexed the knee, subluxed the tibia, sized to a 6 just on the medial third of the tibial tubercle.  This was pinned.  I drilled and harvested bone centrally and impacted in the distal femur.  I drilled centrally.  I then used a punch guide prior to  that.  I used an osteotome due to hard bone.  Next, I turned attention back to the femur.  A box cut jig was applied, bisecting the femoral condyles.  Then, I performed a box cut.  Then, I placed a trial of 6 femur.  It fit flush, 6 mm insert, reduced  the knee and had full extension and good stability, full flexion.  Negative anterior drawer.  Everted the patella, measured to a 25, plane to a 15.5 with a patellar jig, sized to a 32.  Utilizing our trial paddle parallel to the joint surface, drilled a  peg hole and placed the trial patella and reduced it.  Excellent patellofemoral tracking.  All instrumentation was then removed.  Checked posteriorly. Remnants of the menisci excised.  Cauterized the geniculates, used  pulsatile lavage.  Thoroughly  cleaned all bony surfaces.  The knee was flexed.  All surfaces thoroughly dried.  Mixed cement on the back table under vacuum.  Cement was then placed in the proximal tibia digitally pressurizing it.  After drill holes were placed in the proximal tibia,  cement was placed in the tibial tray and impacted into place.  Redundant cement  was removed.  Cement on the femoral component of the femur was impacted into place, fit flush, redundant cement removed.  Six trial insert was placed.  The knee was reduced  and held an axial load throughout the curing of the cement.  Redundant cement removed.  The cement had clamped the patella and Marcaine  with epinephrine  followed by Prontosan was placed in the wound.  During the curing of the cement, the wound was  covered.  After curing the cement, the tourniquet was deflated at 5 minutes and any bleeding was cauterized with Aquamantys.  Following achieving hemostasis, meticulously removed all redundant cement.  I removed the trial insert.  Again, removed all  redundant cement.  Copiously irrigated with pulsatile lavage followed by Prontosan.  I placed a 6 permanent polyethylene insert.  Reduced it.  Had full extension, full flexion.  Good stability with varus and valgus stress at 0 and 30 degrees.  Negative  anterior drawer, upon the lateral release. In mid flexion, reapproximated patellar arthrotomy with #1 Vicryl interrupted figure-of-eight sutures followed by a running Stratafix.  Excellent patellofemoral tracking following this.  Subcutaneous was  copiously irrigated with pulsatile lavage followed by Prontosan.  Multiple layers of 2-0 were used to close the subcutaneous tissue.  She had an ample subcutaneous adipose layer.  Skin was reapproximated with subcuticular Monocryl.  Sterile dressing was  applied, placed in an immobilizer and transported to the recovery room in satisfactory condition.  The patient tolerated the procedure well with no complications.    Assistant Amy Kansky, [place] was used throughout the case for patient positioning, exposure, closure.  BLOOD LOSS:  50 mL.    MUK D: 07/08/2023 3:28:29 pm T: 07/09/2023 12:06:00 am  JOB: [id number]/ [id number]

## 2023-07-09 NOTE — Evaluation (Signed)
 Physical Therapy Evaluation Patient Details Name: Amanda Ayers MRN: [id number] DOB: 1970-12-11 Today's Date: 07/09/2023  History of Present Illness  Pt is a 53 year old female s/p R TKA on 07/08/23  Clinical Impression  Pt is s/p TKA resulting in the deficits listed below (see PT Problem List).  Pt will benefit from acute skilled PT to increase their independence and safety with mobility to allow discharge.  Pt ambulated in hallway and performed LE exercises.  Pt provided with HEP handout. Pt anticipates d/c home with significant other today so will return for afternoon session to ambulate again and practice one step.         If plan is discharge home, recommend the following: Assistance with cooking/housework;Help with stairs or ramp for entrance;Assist for transportation   Can travel by private vehicle        Equipment Recommendations None recommended by PT  Recommendations for Other Services       Functional Status Assessment Patient has had a recent decline in their functional status and demonstrates the ability to make significant improvements in function in a reasonable and predictable amount of time.     Precautions / Restrictions Precautions Precautions: Fall;Knee Precaution/Restrictions Comments: able to perform SLR Required Braces or Orthoses: Knee Immobilizer - Right Restrictions Weight Bearing Restrictions Per Provider Order: No Other Position/Activity Restrictions: WBAT      Mobility  Bed Mobility Overal bed mobility: Needs Assistance Bed Mobility: Supine to Sit     Supine to sit: Contact guard, HOB elevated     General bed mobility comments: verbal cues for self assist    Transfers Overall transfer level: Needs assistance Equipment used: Rolling walker (2 wheels) Transfers: Sit to/from Stand Sit to Stand: Min assist           General transfer comment: verbal cues for UE and LE positioning, assist to rise and with stabilizing with change of  hand placement to RW    Ambulation/Gait Ambulation/Gait assistance: Contact guard assist Gait Distance (Feet): 120 Feet Assistive device: Rolling walker (2 wheels) Gait Pattern/deviations: Step-to pattern, Decreased stance time - right, Antalgic Gait velocity: decr     General Gait Details: verbal cues for sequence, RW positioning, posture, step length  Stairs            Wheelchair Mobility     Tilt Bed    Modified Rankin (Stroke Patients Only)       Balance                                             Pertinent Vitals/Pain Pain Assessment Pain Assessment: 0-10 Pain Score: 5  Pain Location: right knee Pain Descriptors / Indicators: Sore, Aching Pain Intervention(s): Monitored during session, Repositioned    Home Living Family/patient expects to be discharged to:: Private residence Living Arrangements: Spouse/significant other   Type of Home: House Home Access: Stairs to enter   Secretary/administrator of Steps: 1   Home Layout: One level Home Equipment: Agricultural consultant (2 wheels)      Prior Function Prior Level of Function : Independent/Modified Independent                     Extremity/Trunk Assessment        Lower Extremity Assessment Lower Extremity Assessment: RLE deficits/detail RLE Deficits / Details: able to perform SLR, able to perform ankle pumps,  right knee AAROM 5-45*       Communication   Communication Communication: No apparent difficulties    Cognition Arousal: Alert Behavior During Therapy: WFL for tasks assessed/performed   PT - Cognitive impairments: No apparent impairments                         Following commands: Intact       Cueing       General Comments      Exercises Total Joint Exercises Ankle Circles/Pumps: AROM, Both, 10 reps Quad Sets: AROM, Both, 10 reps Hip ABduction/ADduction: AAROM, Right, 10 reps Straight Leg Raises: AAROM, Right, 10 reps Knee Flexion:  AROM, Right, Seated, 10 reps   Assessment/Plan    PT Assessment Patient needs continued PT services  PT Problem List Decreased mobility;Decreased activity tolerance;Decreased strength;Decreased range of motion;Pain;Decreased knowledge of use of DME;Decreased knowledge of precautions       PT Treatment Interventions Stair training;Gait training;DME instruction;Balance training;Functional mobility training;Therapeutic activities;Therapeutic exercise;Patient/family education    PT Goals (Current goals can be found in the Care Plan section)  Acute Rehab PT Goals PT Goal Formulation: With patient Time For Goal Achievement: 07/16/23 Potential to Achieve Goals: Good    Frequency 7X/week     Co-evaluation               AM-PAC PT "6 Clicks" Mobility  Outcome Measure Help needed turning from your back to your side while in a flat bed without using bedrails?: A Little Help needed moving from lying on your back to sitting on the side of a flat bed without using bedrails?: A Little Help needed moving to and from a bed to a chair (including a wheelchair)?: A Little Help needed standing up from a chair using your arms (e.g., wheelchair or bedside chair)?: A Little Help needed to walk in hospital room?: A Little Help needed climbing 3-5 steps with a railing? : A Little 6 Click Score: 18    End of Session Equipment Utilized During Treatment: Gait belt Activity Tolerance: Patient tolerated treatment well Patient left: in chair;with call bell/phone within reach;with chair alarm set Nurse Communication: Mobility status PT Visit Diagnosis: Difficulty in walking, not elsewhere classified (R26.2)    Time: 1610-9604 PT Time Calculation (min) (ACUTE ONLY): 18 min   Charges:   PT Evaluation $PT Eval Low Complexity: 1 Low   PT General Charges $$ ACUTE PT VISIT: 1 Visit        Henretta Lodge PT, DPT Physical Therapist Acute Rehabilitation Services Office: [phone]   Myna Asal  [place] 07/09/2023, 12:30 PM

## 2023-07-09 NOTE — Progress Notes (Signed)
   Subjective:  Patient reports pain as mild.  Feeling good this morning.  Hopeful to go home today.  No overnight events.  Objective:   VITALS:   Vitals:   07/08/23 2126 07/09/23 0141 07/09/23 0556 07/09/23 0958  BP: 136/74 137/83 (!) 147/88 (!) 149/77  Pulse: (!) 59 71 62 68  Resp: 16 16 16 18   Temp: 97.7 F (36.5 C) 98.3 F (36.8 C) 98.3 F (36.8 C) 98.2 F (36.8 C)  TempSrc: Oral Oral Oral Oral  SpO2: 100% 97% 100% 100%  Weight:      Height:        Neurologically intact ABD soft Neurovascular intact Sensation intact distally Intact pulses distally Dorsiflexion/Plantar flexion intact Incision: dressing C/D/I Compartment soft   Lab Results  Component Value Date   WBC 6.6 06/28/2023   HGB 12.4 06/28/2023   HCT 39.2 06/28/2023   MCV 96.3 06/28/2023   PLT 290 06/28/2023   BMET    Component Value Date/Time   NA 135 07/09/2023 0336   K 4.1 07/09/2023 0336   CL 103 07/09/2023 0336   CO2 22 07/09/2023 0336   GLUCOSE 140 (H) 07/09/2023 0336   BUN 15 07/09/2023 0336   CREATININE 0.90 07/09/2023 0336   CREATININE 0.87 04/13/2022 1350   CALCIUM 9.2 07/09/2023 0336   GFRNONAA >60 07/09/2023 0336   GFRNONAA >60 04/13/2022 1350     Assessment/Plan: 1 Day Post-Op   Principal Problem:   Right knee DJD   Advance diet Up with therapy  - Discharge home today once she clears therapy. - 81 mg aspirin for DVT prophylaxis postop.  Follow-up with Dr. Leighton Punches postoperatively as scheduled.   Amanda Ayers 07/09/2023, 10:36 AM   Brigitte Canard, MD [phone]

## 2023-07-12 ENCOUNTER — Encounter (HOSPITAL_COMMUNITY): Payer: Self-pay | Admitting: Specialist

## 2023-07-12 ENCOUNTER — Ambulatory Visit: Payer: Self-pay | Admitting: Internal Medicine

## 2023-07-13 LAB — HM MAMMOGRAPHY

## 2023-07-30 IMAGING — MG MM DIGITAL DIAGNOSTIC UNILAT*L* W/ TOMO W/ CAD
8 series · 8 of 24 positions shown · non-contrast
Comparison: 06/19/2021

CLINICAL DATA: Patient returns after baseline screening study for
evaluation possible LEFT breast asymmetry.

EXAM:
DIGITAL DIAGNOSTIC UNILATERAL LEFT MAMMOGRAM WITH TOMOSYNTHESIS AND
CAD; ULTRASOUND LEFT BREAST LIMITED
TECHNIQUE: Left digital diagnostic mammography and breast tomosynthesis was
performed. The images were evaluated with computer-aided detection.;
Targeted ultrasound examination of the left breast was performed.

[L CC synth-2D (1 of 3)]
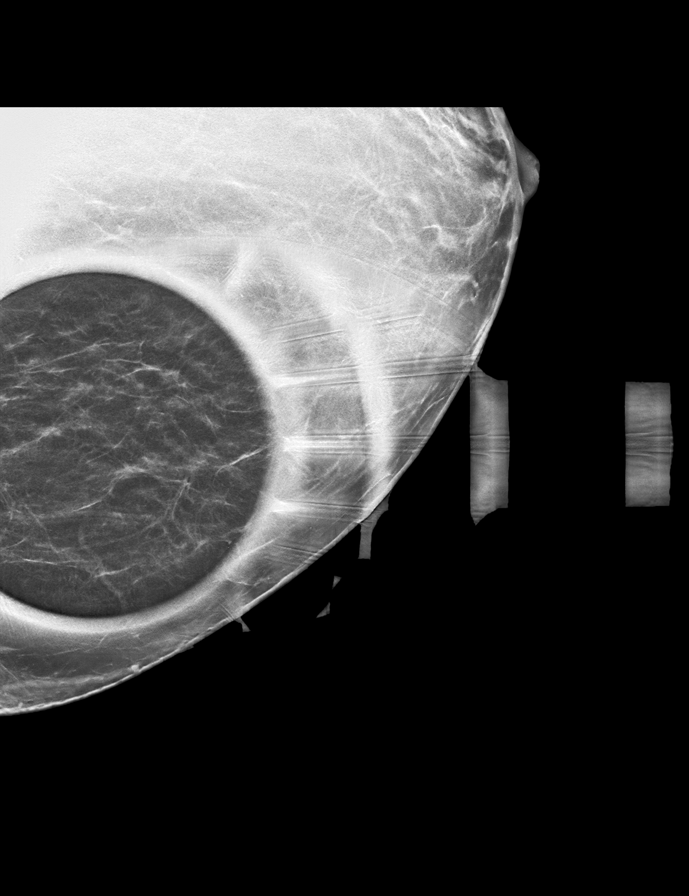

[L CC synth-2D (2 of 3)]
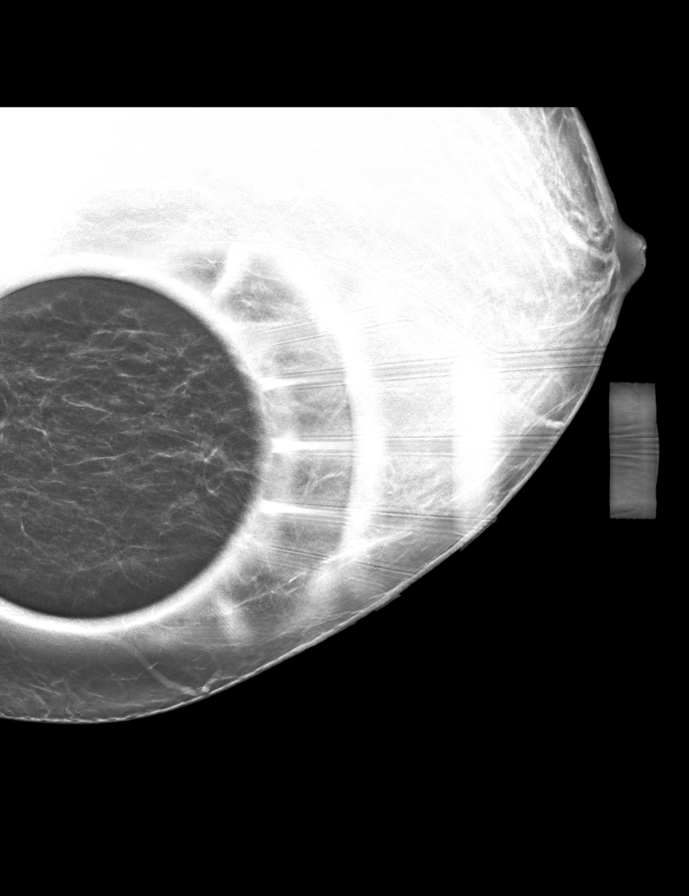

[L CC synth-2D (3 of 3)]
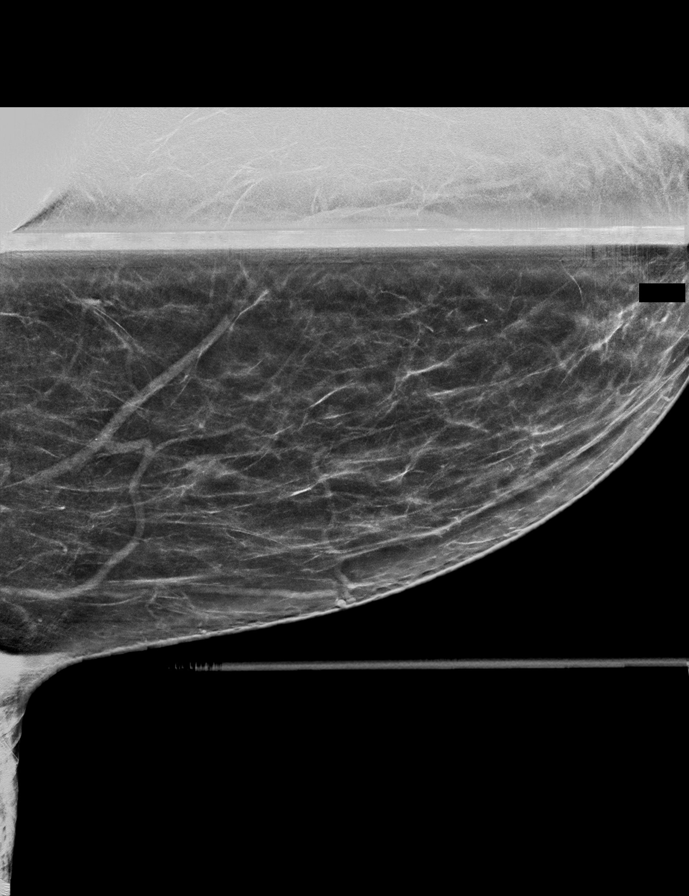

[L MLO synth-2D]
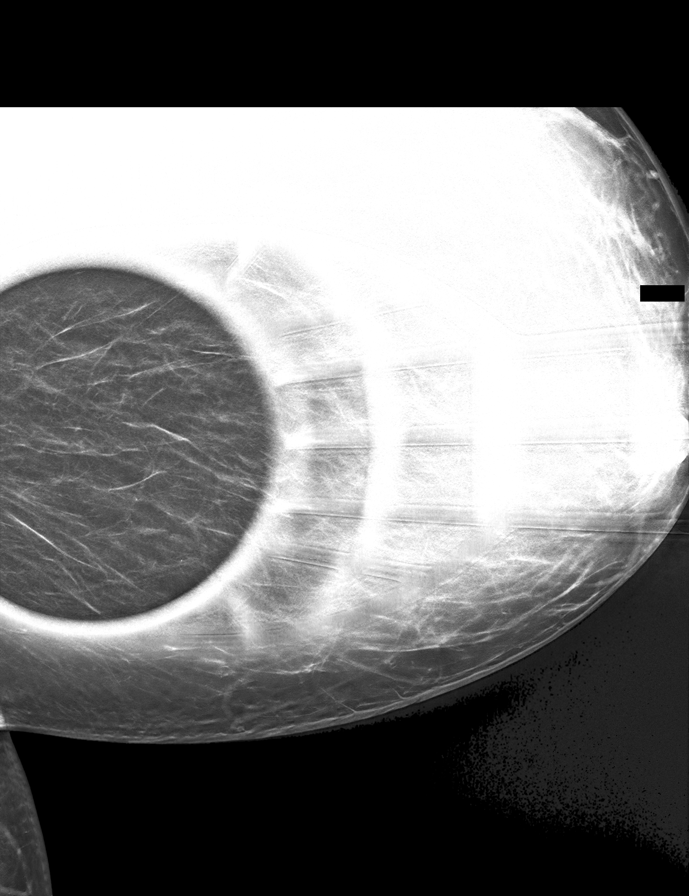

[L MLO tomo · tomo slice 31/61.0]
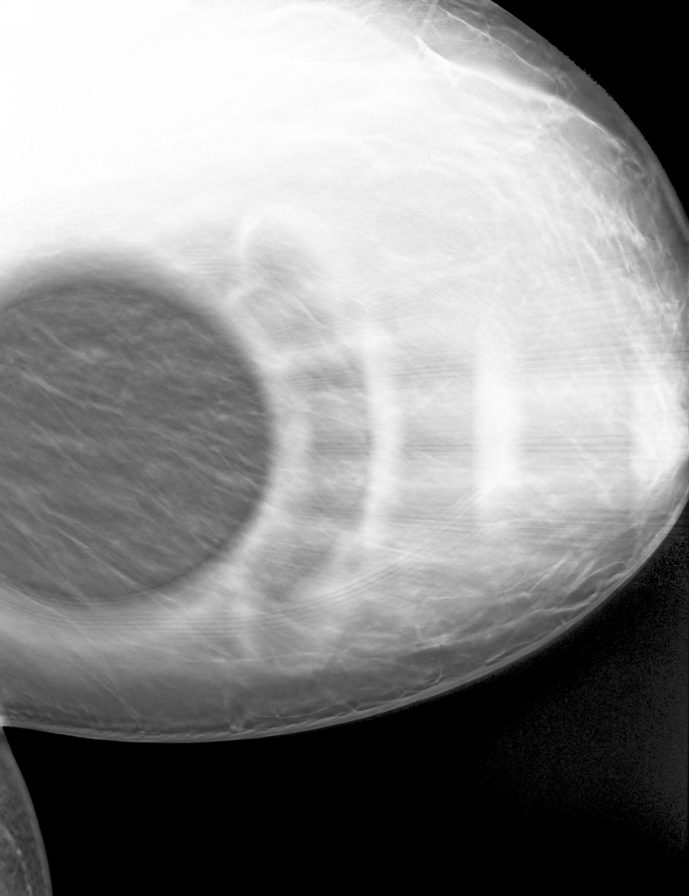

[L CC tomo (1 of 3) · tomo slice 21/42.0]
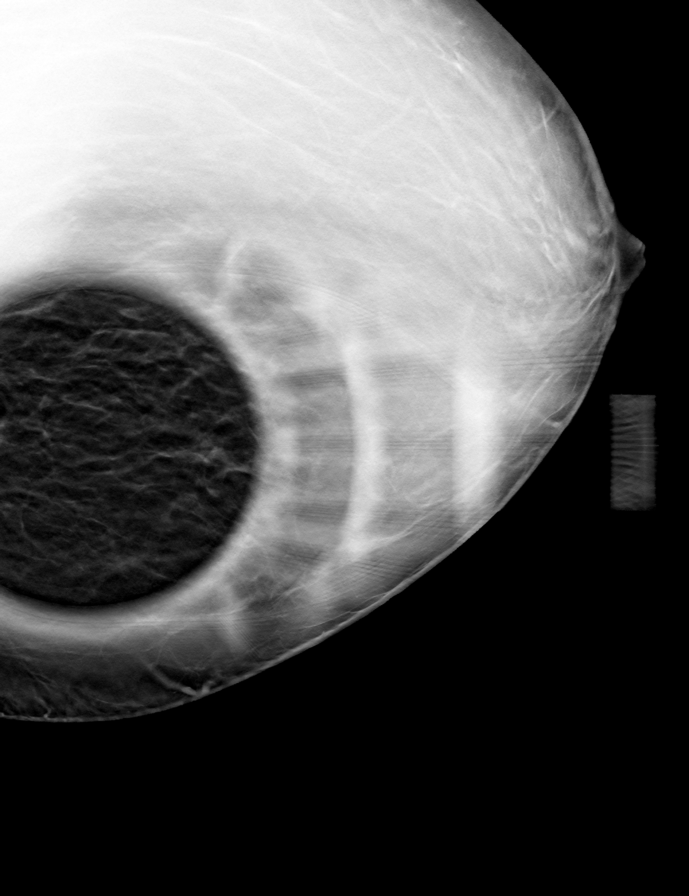

[L CC tomo (2 of 3) · tomo slice 33/65.0]
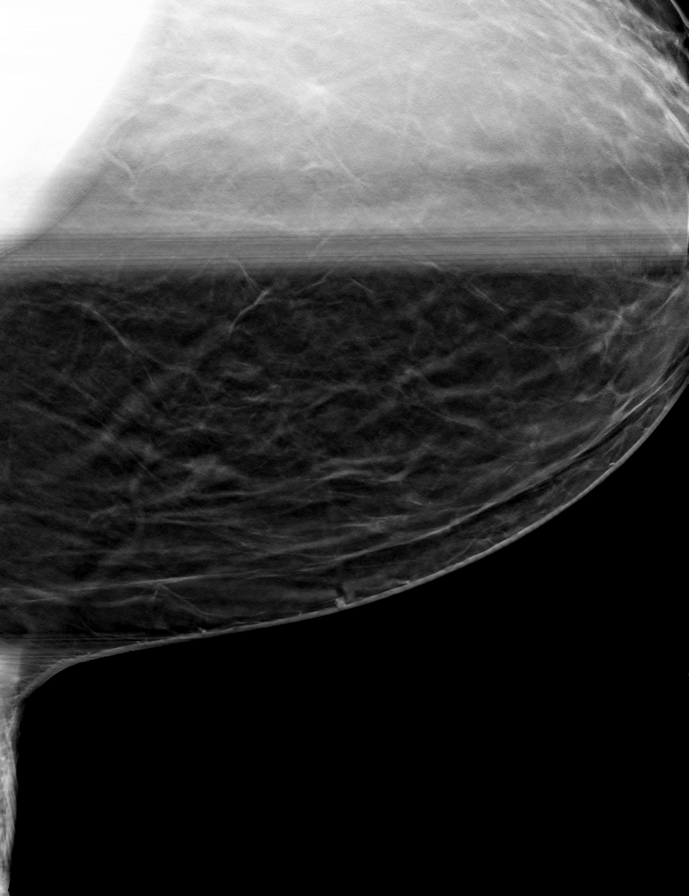

[L CC tomo (3 of 3) · tomo slice 25/49.0]
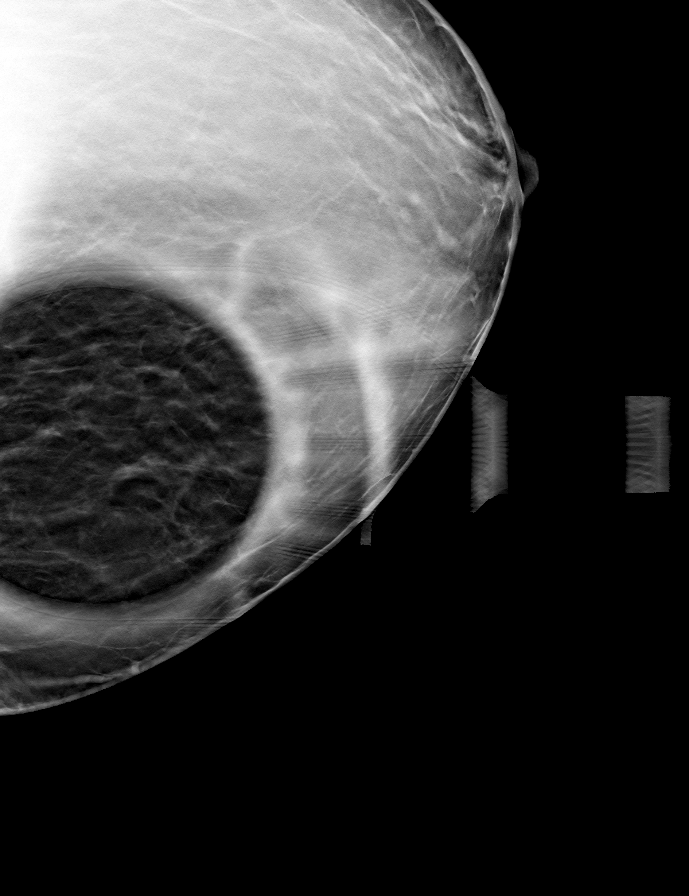

[8 of 24 positions shown; findings below may reference images not displayed]

ACR Breast Density Category b: There are scattered areas of
fibroglandular density.
FINDINGS: Additional 2-D and 3-D images are performed. These views confirm
presence of a spiculated mass in the MEDIAL aspect of the LEFT
breast.

On physical exam, I palpate no mass in the MEDIAL aspect of the LEFT
breast.

Targeted ultrasound is performed, showing a spiculated irregular
mass with indistinct and spiculated margins in the 10 o'clock
location of the LEFT breast 20 centimeters from the nipple. No
internal blood flow identified with Doppler evaluation. Mass
measures 0.9 x 0.5 x 0.6 centimeters and is taller than wide.

Evaluation of the LEFT axilla is negative for adenopathy.
IMPRESSION: Suspicious mass in the 10 o'clock location of the LEFT breast.

RECOMMENDATION:
Ultrasound-guided core biopsy of LEFT breast mass.

I have discussed the findings and recommendations with the patient.
If applicable, a reminder letter will be sent to the patient
regarding the next appointment.

BI-RADS CATEGORY  4: Suspicious.

## 2023-08-03 IMAGING — US US BREAST BX W LOC DEV 1ST LESION IMG BX SPEC US GUIDE*L*
1 series · 11 of 11 positions shown · non-contrast
Comparison: None Available.
COMPARISON: None Available.

Addendum:
CLINICAL DATA: Patient with a LEFT breast mass at the 10 o'clock
axis presents today for ultrasound-guided core biopsy.

EXAM:
ULTRASOUND GUIDED LEFT BREAST CORE NEEDLE BIOPSY

[Series 1: us breast bx w loc dev 1st lesion img bx spec us g · 0.07mm/px · 11 of 11 slices shown]
[im 1/11]
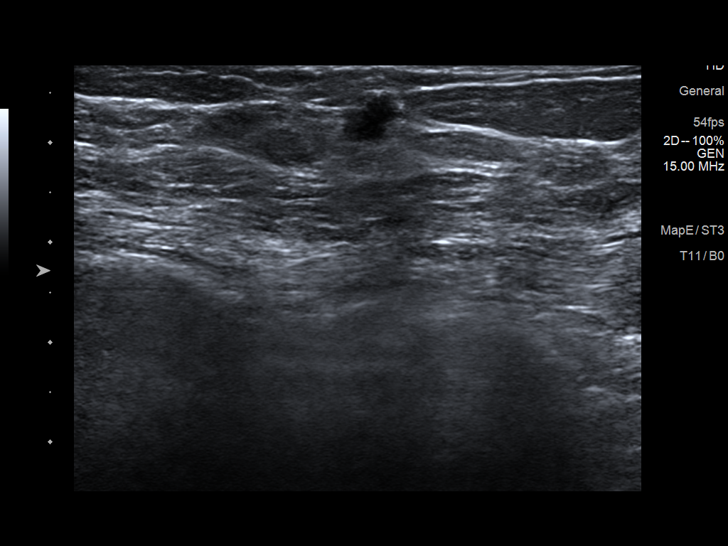
[im 2/11]
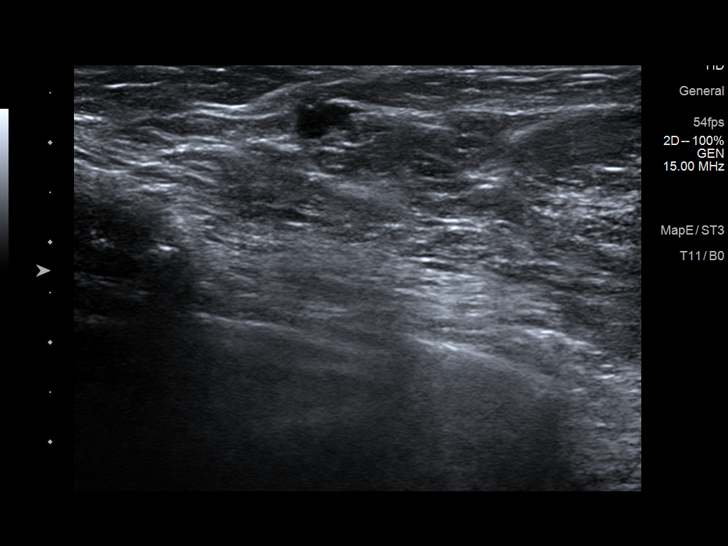
[im 3/11]
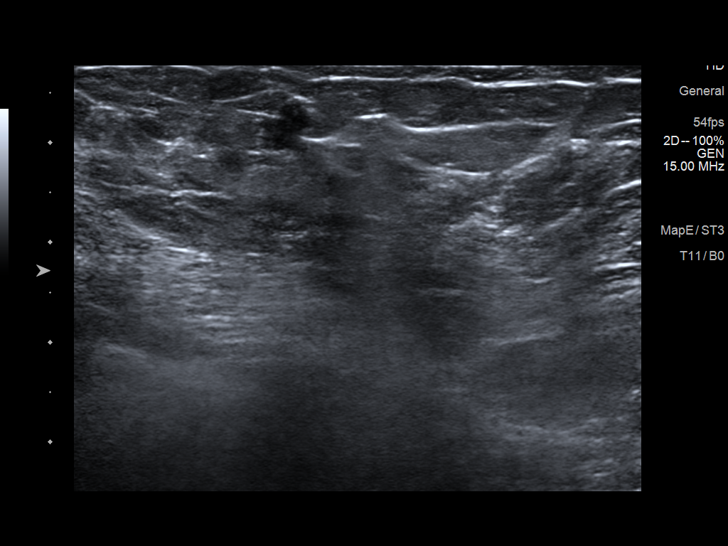
[im 4/11]
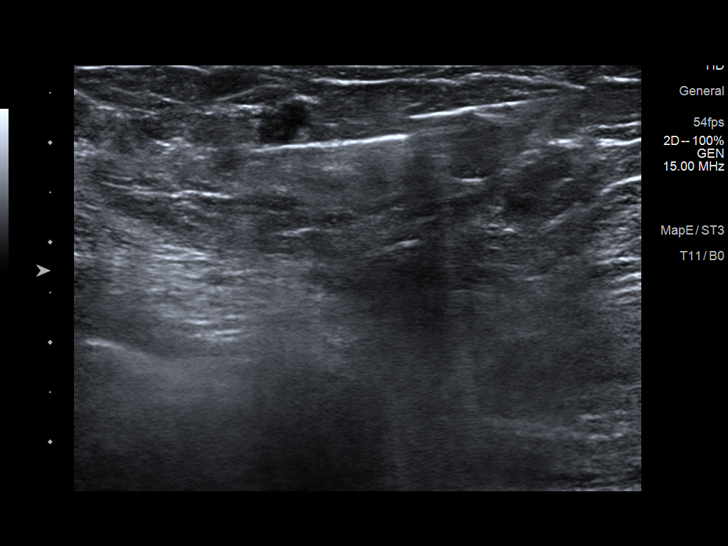
[im 5/11]
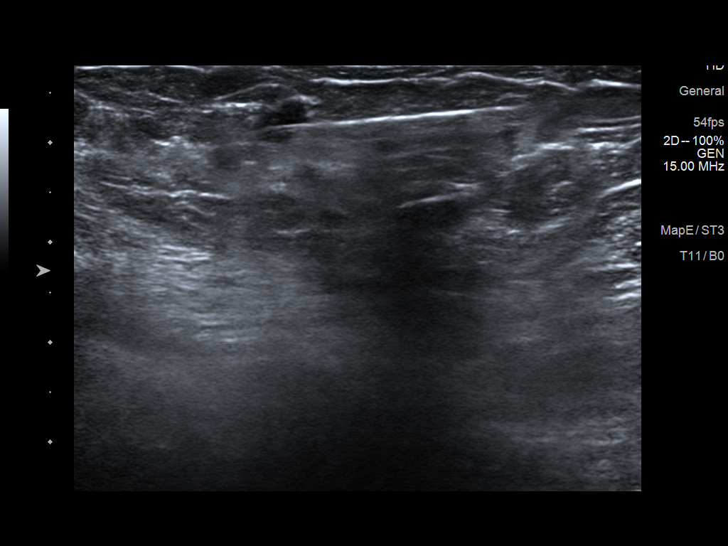
[im 6/11]
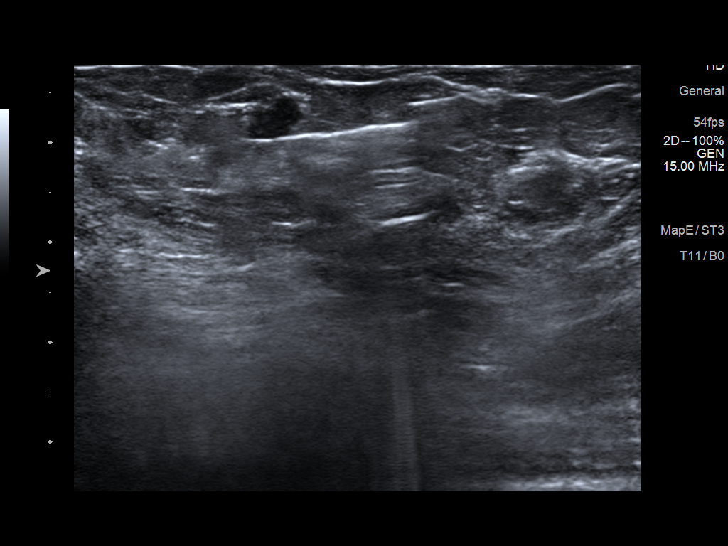
[im 7/11]
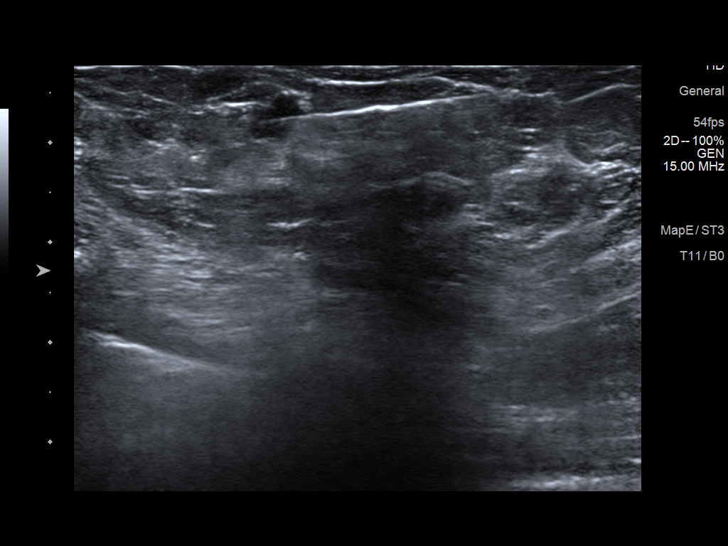
[im 8/11]
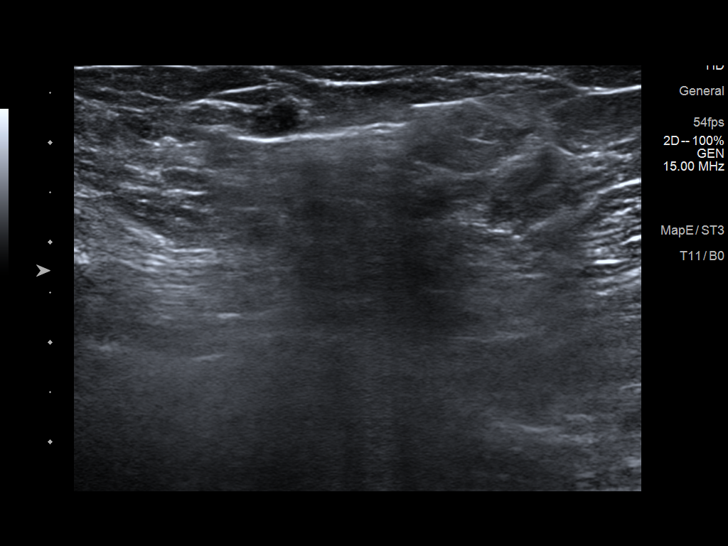
[im 9/11]
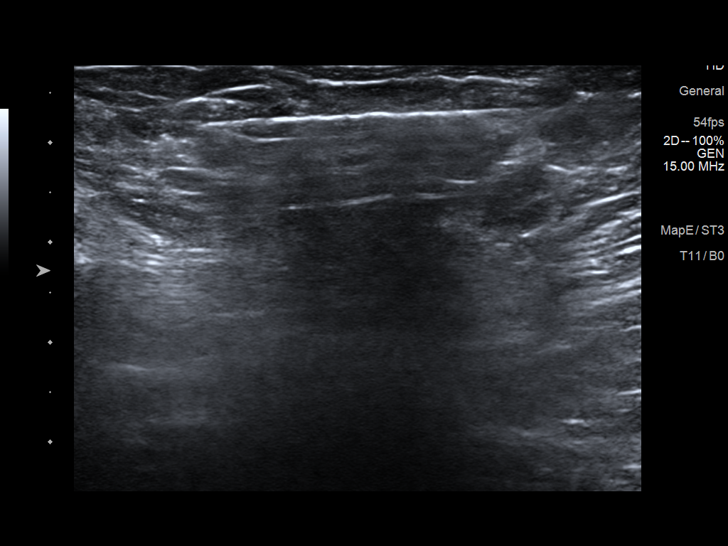
[im 10/11]
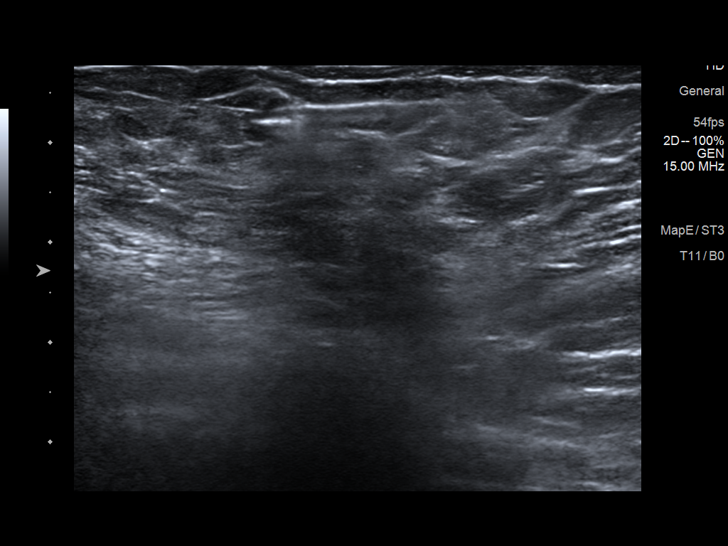
[im 11/11]
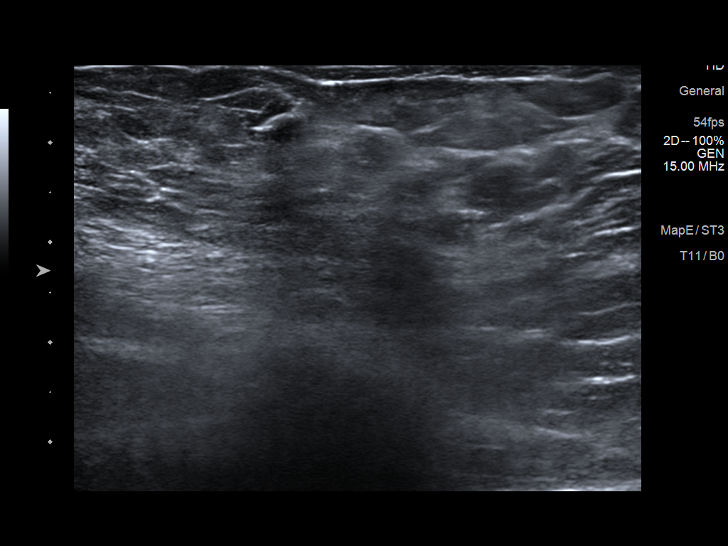

[11 of 11 positions shown; findings below may reference images not displayed]



Lesion quadrant: Upper inner quadrant

Using sterile technique and 1% Lidocaine as local anesthetic, under
direct ultrasound visualization, a 12 gauge Czaro device was
used to perform biopsy of the LEFT breast mass at the 10 o'clock
axis using an inferior approach. At the conclusion of the procedure
, a ribbon shaped tissue marker clip was deployed into the biopsy
cavity. Follow up 2 view mammogram was performed and dictated
separately.
IMPRESSION: Ultrasound guided biopsy of the LEFT breast mass at the 10 o'clock
axis. No apparent complications.

ADDENDUM:
Pathology revealed GRADE II INVASIVE DUCTAL CARCINOMA of the LEFT
breast, 10:00 o'clock, 20 cmfn, (ribbon clip). This was found to be
concordant by Dr. Nola Oxendine.

Pathology results were discussed with the patient by telephone. The
patient reported doing well after the biopsy with tenderness at the
site. Post biopsy instructions and care were reviewed and questions
were answered. The patient was encouraged to call The [REDACTED] for any additional concerns. My direct phone
number was provided.

The patient was referred to [REDACTED]
[REDACTED] at [REDACTED] on

Pathology results reported by Madruga Goya, RN on 07/08/2021.



Lesion quadrant: Upper inner quadrant

Using sterile technique and 1% Lidocaine as local anesthetic, under
direct ultrasound visualization, a 12 gauge Czaro device was
used to perform biopsy of the LEFT breast mass at the 10 o'clock
axis using an inferior approach. At the conclusion of the procedure
, a ribbon shaped tissue marker clip was deployed into the biopsy
cavity. Follow up 2 view mammogram was performed and dictated
separately.
IMPRESSION: Ultrasound guided biopsy of the LEFT breast mass at the 10 o'clock
axis. No apparent complications.

## 2024-02-13 ENCOUNTER — Ambulatory Visit: Admitting: Podiatry

## 2024-02-20 ENCOUNTER — Encounter: Admitting: Internal Medicine

## 2024-02-27 ENCOUNTER — Encounter: Admitting: Internal Medicine

## 2024-04-02 ENCOUNTER — Encounter: Admitting: Internal Medicine
